# Patient Record
Sex: Male | Born: 1980 | Smoking: Never smoker
Health system: Southern US, Community
[De-identification: ages and names within clinical notes are randomized; demographics above are authoritative.]

## PROBLEM LIST (undated history)

## (undated) DIAGNOSIS — L409 Psoriasis, unspecified: Secondary | ICD-10-CM

## (undated) DIAGNOSIS — M1990 Unspecified osteoarthritis, unspecified site: Secondary | ICD-10-CM

## (undated) HISTORY — DX: Unspecified osteoarthritis, unspecified site: M19.90

## (undated) HISTORY — DX: Psoriasis, unspecified: L40.9

---

## 2014-07-29 ENCOUNTER — Ambulatory Visit (HOSPITAL_COMMUNITY)
Admission: RE | Admit: 2014-07-29 | Discharge: 2014-07-29 | Disposition: A | Discharge status: Home / Self Care | Payer: Managed Care, Other (non HMO) | Source: Ambulatory Visit | Attending: Rheumatology | Admitting: Rheumatology

## 2014-07-29 ENCOUNTER — Other Ambulatory Visit (HOSPITAL_COMMUNITY): Payer: Self-pay | Admitting: Rheumatology

## 2014-07-29 DIAGNOSIS — Z01818 Encounter for other preprocedural examination: Secondary | ICD-10-CM | POA: Diagnosis present

## 2014-07-29 DIAGNOSIS — M1990 Unspecified osteoarthritis, unspecified site: Secondary | ICD-10-CM | POA: Diagnosis not present

## 2014-07-29 DIAGNOSIS — Z9225 Personal history of immunosupression therapy: Secondary | ICD-10-CM

## 2015-08-18 ENCOUNTER — Other Ambulatory Visit (HOSPITAL_COMMUNITY): Payer: Self-pay | Admitting: Rheumatology

## 2015-08-18 ENCOUNTER — Ambulatory Visit (HOSPITAL_COMMUNITY)
Admission: RE | Admit: 2015-08-18 | Discharge: 2015-08-18 | Disposition: A | Discharge status: Home / Self Care | Payer: Managed Care, Other (non HMO) | Source: Ambulatory Visit | Attending: Rheumatology | Admitting: Rheumatology

## 2015-08-18 DIAGNOSIS — R7611 Nonspecific reaction to tuberculin skin test without active tuberculosis: Secondary | ICD-10-CM | POA: Diagnosis present

## 2016-02-02 ENCOUNTER — Ambulatory Visit (INDEPENDENT_AMBULATORY_CARE_PROVIDER_SITE_OTHER): Payer: Managed Care, Other (non HMO) | Admitting: Rheumatology

## 2016-02-02 DIAGNOSIS — M79642 Pain in left hand: Secondary | ICD-10-CM

## 2016-02-02 DIAGNOSIS — M79672 Pain in left foot: Secondary | ICD-10-CM | POA: Diagnosis not present

## 2016-02-02 DIAGNOSIS — M79641 Pain in right hand: Secondary | ICD-10-CM | POA: Diagnosis not present

## 2016-02-02 DIAGNOSIS — L408 Other psoriasis: Secondary | ICD-10-CM

## 2016-02-02 DIAGNOSIS — M79671 Pain in right foot: Secondary | ICD-10-CM | POA: Diagnosis not present

## 2016-02-02 DIAGNOSIS — Z79899 Other long term (current) drug therapy: Secondary | ICD-10-CM

## 2016-02-02 DIAGNOSIS — L4050 Arthropathic psoriasis, unspecified: Secondary | ICD-10-CM | POA: Diagnosis not present

## 2016-03-23 ENCOUNTER — Other Ambulatory Visit: Payer: Self-pay | Admitting: Rheumatology

## 2016-03-24 ENCOUNTER — Other Ambulatory Visit: Payer: Self-pay | Admitting: Rheumatology

## 2016-03-24 NOTE — Telephone Encounter (Signed)
Last Visit: 02/02/16 Next Visit: 07/05/16 Labs: 02/03/16 WNL  Okay to refill MTX?

## 2016-03-24 NOTE — Telephone Encounter (Signed)
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   Okay to refill Humira?

## 2016-03-29 ENCOUNTER — Encounter: Payer: Self-pay | Admitting: Rheumatology

## 2016-03-29 ENCOUNTER — Telehealth: Payer: Self-pay | Admitting: Rheumatology

## 2016-03-29 ENCOUNTER — Ambulatory Visit (INDEPENDENT_AMBULATORY_CARE_PROVIDER_SITE_OTHER): Payer: Managed Care, Other (non HMO) | Admitting: Rheumatology

## 2016-03-29 VITALS — BP 118/70 | HR 78 | Resp 14 | Ht 70.0 in | Wt 173.0 lb

## 2016-03-29 DIAGNOSIS — R7612 Nonspecific reaction to cell mediated immunity measurement of gamma interferon antigen response without active tuberculosis: Secondary | ICD-10-CM | POA: Diagnosis not present

## 2016-03-29 DIAGNOSIS — M25551 Pain in right hip: Secondary | ICD-10-CM | POA: Diagnosis not present

## 2016-03-29 DIAGNOSIS — L4050 Arthropathic psoriasis, unspecified: Secondary | ICD-10-CM

## 2016-03-29 DIAGNOSIS — R7989 Other specified abnormal findings of blood chemistry: Secondary | ICD-10-CM | POA: Diagnosis not present

## 2016-03-29 DIAGNOSIS — Z79899 Other long term (current) drug therapy: Secondary | ICD-10-CM | POA: Diagnosis not present

## 2016-03-29 DIAGNOSIS — L408 Other psoriasis: Secondary | ICD-10-CM | POA: Diagnosis not present

## 2016-03-29 DIAGNOSIS — R945 Abnormal results of liver function studies: Secondary | ICD-10-CM

## 2016-03-29 MED ORDER — METHOTREXATE 2.5 MG PO TABS
15.0000 mg | ORAL_TABLET | ORAL | 0 refills | Status: DC
Start: 2016-03-29 — End: 2016-03-30

## 2016-03-29 MED ORDER — DICLOFENAC SODIUM 1 % TD GEL: TRANSDERMAL | 3 refills | Status: DC

## 2016-03-29 NOTE — Progress Notes (Signed)
Rheumatology Medication Review by a Pharmacist Does the patient feel that his/her medications are working for him/her?  Yes except he reports he is having hip pain since his methotrexate dose was reduced.   Has the patient been experiencing any side effects to the medications prescribed?  No Does the patient have any problems obtaining medications?  No  Issues to address at subsequent visits: None   Pharmacist comments:  [name] is a pleasant 35 yo M who presents for follow up of psoriatic arthritis and psoriasis.  He is currently taking Humira 40 mg every other week and methotrexate 4 tablets per week.  Methotrexate dose was reduced from 8 tablets per week to 4 tablets per week on 10/15/15 due to elevations in ALT.  Most recent standing orders were on 02/02/16.  He will be due for standing labs again in January 2018.  Patient has yearly chest x-ray.  Most recent chest x-ray was on 08/18/15.  He will be due for chest x-ray again in May 2018.  Patient denied any medication related questions or concerns at this time.    Lilla Shookachel Henderson, Pharm.D., BCPS Clinical Pharmacist Pager: [phone] Phone: [phone] 03/29/2016 9:07 AM

## 2016-03-29 NOTE — Patient Instructions (Addendum)
Keep march  2018 appt We will see if methotrexate 8 per week Works well for you.     Hip Exercises Ask your health care provider which exercises are safe for you. Do exercises exactly as told by your health care provider and adjust them as directed. It is normal to feel mild stretching, pulling, tightness, or discomfort as you do these exercises, but you should stop right away if you feel sudden pain or your pain gets worse.Do not begin these exercises until told by your health care provider. STRETCHING AND RANGE OF MOTION EXERCISES  These exercises warm up your muscles and joints and improve the movement and flexibility of your hip. These exercises also help to relieve pain, numbness, and tingling. Exercise A: Hamstrings, Supine  1. Lie on your back. 2. Loop a belt or towel over the ball of your left / rightfoot. The ball of your foot is on the walking surface, right under your toes. 3. Straighten your left / rightknee and slowly pull on the belt to raise your leg.  Do not let your left / right knee bend while you do this.  Keep your other leg flat on the floor.  Raise the left / right leg until you feel a gentle stretch behind your left / right knee or thigh. 4. Hold this position for __________ seconds. 5. Slowly return your leg to the starting position. Repeat __________ times. Complete this stretch __________ times a day. Exercise B: Hip Rotators  1. Lie on your back on a firm surface. 2. Hold your left / right knee with your left / right hand. Hold your ankle with your other hand. 3. Gently pull your left / right knee and rotate your lower leg toward your other shoulder.  Pull until you feel a stretch in your buttocks.  Keep your hips and shoulders firmly planted while you do this stretch. 4. Hold this position for __________ seconds. Repeat __________ times. Complete this stretch __________ times a day. Exercise C: V-Sit (Hamstrings and Adductors)  1. Sit on the floor  with your legs extended in a large "V" shape. Keep your knees straight during this exercise. 2. Start with your head and chest upright, then bend at your waist to reach for your left foot (position A). You should feel a stretch in your right inner thigh. 3. Hold this position for __________ seconds. Then slowly return to the upright position. 4. Bend at your waist to reach forward (position B). You should feel a stretch behind both of your thighs and knees. 5. Hold this position for __________ seconds. Then slowly return to the upright position. 6. Bend at your waist to reach for your right foot (position C). You should feel a stretch in your left inner thigh. 7. Hold this position for __________ seconds. Then slowly return to the upright position. Repeat __________ times. Complete this stretch __________ times a day. Exercise D: Lunge (Hip Flexors)  1. Place your left / right knee on the floor and bend your other knee so that is directly over your ankle. You should be half-kneeling. 2. Keep good posture with your head over your shoulders. 3. Tighten your buttocks to point your tailbone downward. This helps your back to keep from arching too much. 4. You should feel a gentle stretch in the front of your left / right thigh and hip. If you do not feel any resistance, slightly slide your other foot forward and then slowly lunge forward so your knee once  again lines up over your ankle. 5. Make sure your tailbone continues to point downward. 6. Hold this position for __________ seconds. Repeat __________ times. Complete this stretch __________ times a day. STRENGTHENING EXERCISES  These exercises build strength and endurance in your hip. Endurance is the ability to use your muscles for a long time, even after they get tired. Exercise E: Bridge (Hip Extensors)  1. Lie on your back on a firm surface with your knees bent and your feet flat on the floor. 2. Tighten your buttocks muscles and lift your  bottom off the floor until the trunk of your body is level with your thighs.  Do not arch your back.  You should feel the muscles working in your buttocks and the back of your thighs. If you do not feel these muscles, slide your feet 1-2 inches (2.5-5 cm) farther away from your buttocks. 3. Hold this position for __________ seconds. 4. Slowly lower your hips to the starting position. 5. Let your muscles relax completely between repetitions. 6. If this exercise is too easy, try doing it with your arms crossed over your chest. Repeat __________ times. Complete this exercise __________ times a day. Exercise F: Straight Leg Raises - Hip Abductors  1. Lie on your side with your left / right leg in the top position. Lie so your head, shoulder, knee, and hip line up with each other. You may bend your bottom knee to help you balance. 2. Roll your hips slightly forward, so your hips are stacked directly over each other and your left / right knee is facing forward. 3. Leading with your heel, lift your top leg 4-6 inches (10-15 cm). You should feel the muscles in your outer hip lifting.  Do not let your foot drift forward.  Do not let your knee roll toward the ceiling. 4. Hold this position for __________ seconds. 5. Slowly return to the starting position. 6. Let your muscles relax completely between repetitions. Repeat __________ times. Complete this exercise __________ times a day. Exercise G: Straight Leg Raises - Hip Adductors  1. Lie on your side with your left / right leg in the bottom position. Lie so your head, shoulder, knee, and hip line up. You may place your upper foot in front to help you balance. 2. Roll your hips slightly forward, so your hips are stacked directly over each other and your left / right knee is facing forward. 3. Tense the muscles in your inner thigh and lift your bottom leg 4-6 inches (10-15 cm). 4. Hold this position for __________ seconds. 5. Slowly return to the  starting position. 6. Let your muscles relax completely between repetitions. Repeat __________ times. Complete this exercise __________ times a day. Exercise H: Straight Leg Raises - Quadriceps  1. Lie on your back with your left / right leg extended and your other knee bent. 2. Tense the muscles in the front of your left / right thigh. When you do this, you should see your kneecap slide up or see increased dimpling just above your knee. 3. Tighten these muscles even more and raise your leg 4-6 inches (10-15 cm) off the floor. 4. Hold this position for __________ seconds. 5. Keep these muscles tense as you lower your leg. 6. Relax the muscles slowly and completely between repetitions. Repeat __________ times. Complete this exercise __________ times a day. Exercise I: Hip Abductors, Standing 1. Tie one end of a rubber exercise band or tubing to a secure surface, such as a  table or pole. 2. Loop the other end of the band or tubing around your left / right ankle. 3. Keeping your ankle with the band or tubing directly opposite of the secured end, step away until there is tension in the tubing or band. Hold onto a chair as needed for balance. 4. Lift your left / right leg out to your side. While you do this:  Keep your back upright.  Keep your shoulders over your hips.  Keep your toes pointing forward.  Make sure to use your hip muscles to lift your leg. Do not "throw" your leg or tip your body to lift your leg. 5. Hold this position for __________ seconds. 6. Slowly return to the starting position. Repeat __________ times. Complete this exercise __________ times a day. Exercise J: Squats (Quadriceps) 1. Stand in a door frame so your feet and knees are in line with the frame. You may place your hands on the frame for balance. 2. Slowly bend your knees and lower your hips like you are going to sit in a chair.  Keep your lower legs in a straight-up-and-down position.  Do not let your hips  go lower than your knees.  Do not bend your knees lower than told by your health care provider.  If your hip pain increases, do not bend as low. 3. Hold this position for ___________ seconds. 4. Slowly push with your legs to return to standing. Do not use your hands to pull yourself to standing. Repeat __________ times. Complete this exercise __________ times a day. This information is not intended to replace advice given to you by your health care provider. Make sure you discuss any questions you have with your health care provider. Document Released: 04/22/2005 Document Revised: 12/28/2015 Document Reviewed: 03/30/2015 Elsevier Interactive Patient Education  2017 [employer].    Standing Labs We placed an order today for your standing lab work.    Please come back and get your standing labs in January 2018 and every 3 months.   We have open lab Monday through Friday from 8:30-11:30 AM and 1:30-4 PM at the office of Dr. Arbutus Ped, PA.   The office is located at [address], [place], [place] [address] No appointment is necessary.   Labs are drawn by [facility].  You may receive a bill from [facility] for your lab work.

## 2016-03-29 NOTE — Progress Notes (Signed)
Office Visit Note  Patient: Colton Anthony             Date of Birth: 1981/02/21           MRN: [id number]             PCP: No PCP Per Patient Referring: No ref. provider found Visit Date: 03/29/2016 Occupation: @GUAROCC @    Subjective:  Back Pain (right SI joint are painful x1week started after bowling)   History of Present Illness: Colton Anthony is a 35 y.o. male  Last seen February 02 2016. Patient has a history of psoriatic arthritis and psoriasis and has been doing well with his Humira every 2 weeks and methotrexate 4 pills per week and folic acid 1 per day. Certainly she he's been exercising more and states that he's having increased pain to his right foot. He notes that when he was on 8 pills of methotrexate per week he was doing the same amount of exercising and he did not have any pain anywhere to his body. He is suggesting to increase his methotrexate from 4 pills per week to 6 pills per week to see if that will resolve his right foot pain and I'm agreeable.  About a week ago patient was bowling. He bowls with his left hand. She was bowling I believe he strained his right hip/buttock muscles. GERD day everything resolved. Then in an attempt to explain to his wife what was happening he recuperated the movements that originally injured his right hip/gluteus area. Thereafter he restarted having the pain that had originally resolved.  No other complaints. No patient has a history of TB coping positive and INH treatment therefore he gets yearly chest x-rays. This will be due May 2018. He does not have any Voltaren gel and I'll give him prescription for same.  Activities of Daily Living:  Patient reports morning stiffness for 15 minutes.   Patient Reports nocturnal pain.  Difficulty dressing/grooming: Denies Difficulty climbing stairs: Reports Difficulty getting out of chair: Reports Difficulty using hands for taps, buttons, cutlery, and/or writing: Denies   Review of Systems    Constitutional: Negative for fatigue.  HENT: Negative for mouth sores and mouth dryness.   Eyes: Negative for dryness.  Respiratory: Negative for shortness of breath.   Gastrointestinal: Negative for constipation and diarrhea.  Musculoskeletal: Negative for myalgias and myalgias.  Skin: Negative for sensitivity to sunlight.  Neurological: Negative for memory loss.  Psychiatric/Behavioral: Negative for sleep disturbance.    PMFS History:  Patient Active Problem List   Diagnosis Date Noted  . 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 Other psoriasis 03/29/2016  . 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 High risk medication use 03/29/2016  . Elevated LFTs 03/29/2016    No past medical history on file.  No family history on file. No past surgical history on file. Social History   Social History Narrative  . No narrative on file     Objective: Vital Signs: BP 118/70   Pulse 78   Resp 14   Ht 5\' 10"  (1.778 m)   Wt 173 lb (78.5 kg)   BMI 24.82 kg/m    Physical Exam  Constitutional: He is oriented to person, place, and time. He appears well-developed and well-nourished.  HENT:  Head: Normocephalic and atraumatic.  Eyes: Conjunctivae and EOM are normal. Pupils are equal, round, and reactive to light.  Neck: Normal range of motion. Neck supple.  Cardiovascular: Normal rate, regular rhythm and normal heart sounds.  Exam reveals no gallop and no friction rub.   No murmur heard. Pulmonary/Chest: Effort normal and breath sounds normal. No respiratory distress. He has no wheezes. He has no rales. He exhibits no tenderness.  Abdominal: Soft. He exhibits no distension and no mass. There is no tenderness. There is no guarding.  Musculoskeletal: Normal range of motion.  Lymphadenopathy:    He has no cervical adenopathy.  Neurological: He is alert and oriented to person, place, and time. He exhibits normal muscle tone. Coordination normal.  Skin: Skin is warm  and dry. Capillary refill takes less than 2 seconds. No rash noted.  Psychiatric: He has a normal mood and affect. His behavior is normal. Judgment and thought content normal.  Nursing note and vitals reviewed.    Musculoskeletal Exam:  Full range of motion of all joints Grip strength is equal and strong bilaterally Fibromyalgia tender points are all absent  CDAI Exam: No CDAI exam completed.  No synovitis on examination  Investigation: Findings:  08/01/2014  positive for TB Gold and he was treated with INH treatment.   Psoriatic Arthritis .  He has erosive disease with dactylitis  07/2014:  CBC, comprehensive metabolic panel, sed rate, hep panel and HIV were negative.  Uric acid was normal.  Rheumatoid factor was negative.  ANA was negative.  CCP was negative.  Immunoglobulin's were normal.  SPEP was normal and TB Gold was positive.   07/01/2016We did obtain x-ray of bilateral knee joints  which were within normal limits without any significant joint space narrowing or chondrocalcinosis.  No significant patellofemoral narrowing.  Labs: 02/02/2016 ===>  CMP with GFR is normal except for bilirubin slightly elevated at 1.3 CBC with differential is normal Labs will be due again in 3 months from this date which is January 2018 and he will come to our office and get that done.  Imaging: No results found.  Speciality Comments: No specialty comments available.    Procedures:  No procedures performed Allergies: Patient has no known allergies.   Assessment / Plan:     Visit Diagnoses: Pain in right hip  Arthropathic psoriasis, unspecified (HCC)  Other psoriasis  High risk medication use - 03/29/2016: ==> humira q 2 weeks; mtx 4/week (some breakthrough pain right foot); folic 1/day;   Elevated LFTs  Positive QuantiFERON-TB Gold test   Currently having pain in his right foot with increased exercise. Patient did not have this pain when he was on the higher dose of methotrexate.  Now that he is on the 4 pills per week he notices the pain. He's asking for us to increase his methotrexate from 4 pills to 6 pills per week and I am agreeable. I'll refill his medication accordingly. If he does really well we will consider going down to 5 pills per week in about 3 months.  Patient gets a yearly chest x-ray because he's TB gold positive and had a treatment with INH in the past. The yearly chest x-ray will be due May 2018.  Plan: Voltaren gel. I've advised the patient how to use this medication to his right hip area. He will use it 3 times a day when necessary. Also I've given him a handout for hip joint exercises Continue Humira every 2 weeks Note that methotrexate was increased to 6 pills per week and a 90 day supply was given  Return to clinic in 3 months for regular follow-up.  Orders: No orders of the defined types were placed in this encounter.  Meds  ordered this encounter  Medications  . methotrexate (RHEUMATREX) 2.5 MG tablet    Sig: Take 6 tablets (15 mg total) by mouth once a week. Caution:Chemotherapy. Protect from light.    Dispense:  72 tablet    Refill:  0    Order Specific Question:   Supervising Provider    Answer:   Pollyann SavoyEVESHWAR, SHAILI [[id number]]  . diclofenac sodium (VOLTAREN) 1 % GEL    Sig: Voltaren Gel 3 grams to 3 large joints upto TID 3 TUBES with 3 refills    Dispense:  3 Tube    Refill:  3    Voltaren Gel 3 grams to 3 large joints upto TID 3 TUBES with 3 refills    Order Specific Question:   Supervising Provider    Answer:   Vanessa KickEVESHWAR, SHAILI [[id number]]    Face-to-face time spent with patient was 30 minutes. 50% of time was spent in counseling and coordination of care.  Follow-Up Instructions: Return in about 6 months (around 09/27/2016).   Tawni PummelNaitik Bralyn Folkert, PA-C

## 2016-03-30 ENCOUNTER — Other Ambulatory Visit: Payer: Self-pay | Admitting: Rheumatology

## 2016-03-30 NOTE — Telephone Encounter (Signed)
Resent/ Mr Leane Callanwala approved this yesterday

## 2016-04-20 NOTE — Telephone Encounter (Signed)
Opened by mistake.

## 2016-04-21 ENCOUNTER — Other Ambulatory Visit: Payer: Self-pay | Admitting: Rheumatology

## 2016-04-21 NOTE — Telephone Encounter (Signed)
[facility] needs to [facility] for his MTX and Humira pen and will not fill from his previous rx. Please send a new rx to [facility] at phone# [phone] Patient is almost out of medication

## 2016-04-22 MED ORDER — ADALIMUMAB 40 MG/0.8ML ~~LOC~~ AJKT: 1.0000 "pen " | AUTO-INJECTOR | SUBCUTANEOUS | 0 refills | Status: DC

## 2016-04-22 MED ORDER — METHOTREXATE 2.5 MG PO TABS: ORAL_TABLET | ORAL | 1 refills | Status: DC

## 2016-04-22 NOTE — Telephone Encounter (Signed)
Prescriptions sent to [facility] per patient request.

## 2016-07-04 ENCOUNTER — Other Ambulatory Visit: Payer: Self-pay | Admitting: Rheumatology

## 2016-07-04 LAB — COMPLETE METABOLIC PANEL WITH GFR
ALK PHOS: 40 U/L (ref 40–115)
ALT: 32 U/L (ref 9–46)
AST: 22 U/L (ref 10–40)
Albumin: 4.5 g/dL (ref 3.6–5.1)
BILIRUBIN TOTAL: 0.7 mg/dL (ref 0.2–1.2)
BUN: 9 mg/dL (ref 7–25)
CALCIUM: 9.5 mg/dL (ref 8.6–10.3)
CO2: 26 mmol/L (ref 20–31)
CREATININE: 0.83 mg/dL (ref 0.60–1.35)
Chloride: 106 mmol/L (ref 98–110)
GFR, Est Non African American: >89 mL/min (ref >=60)
Glucose, Bld: 95 mg/dL (ref 65–99)
Potassium: 4.7 mmol/L (ref 3.5–5.3)
Sodium: 140 mmol/L (ref 135–146)
TOTAL PROTEIN: 7 g/dL (ref 6.1–8.1)

## 2016-07-04 LAB — CBC WITH DIFFERENTIAL/PLATELET
BASOS PCT: 1 %
Basophils Absolute: 55 cells/uL (ref 0–200)
Eosinophils Absolute: 55 cells/uL (ref 15–500)
Eosinophils Relative: 1 %
HCT: 42.4 % (ref 38.5–50.0)
Hemoglobin: 13.9 g/dL (ref 13.2–17.1)
Lymphocytes Relative: 46 %
Lymphs Abs: 2530 cells/uL (ref 850–3900)
MCH: 29.3 pg (ref 27.0–33.0)
MCHC: 32.8 g/dL (ref 32.0–36.0)
MCV: 89.5 fL (ref 80.0–100.0)
MONOS PCT: 8 %
MPV: 11.2 fL (ref 7.5–12.5)
Monocytes Absolute: 440 cells/uL (ref 200–950)
Neutro Abs: 2420 cells/uL (ref 1500–7800)
Neutrophils Relative %: 44 %
PLATELETS: 237 10*3/uL (ref 140–400)
RBC: 4.74 MIL/uL (ref 4.20–5.80)
RDW: 15.1 % — AB (ref 11.0–15.0)
WBC: 5.5 10*3/uL (ref 3.8–10.8)

## 2016-07-05 ENCOUNTER — Ambulatory Visit (INDEPENDENT_AMBULATORY_CARE_PROVIDER_SITE_OTHER): Payer: Managed Care, Other (non HMO) | Admitting: Rheumatology

## 2016-07-05 ENCOUNTER — Encounter: Payer: Self-pay | Admitting: Rheumatology

## 2016-07-05 VITALS — BP 124/70 | HR 68 | Resp 16 | Wt 170.0 lb

## 2016-07-05 DIAGNOSIS — L408 Other psoriasis: Secondary | ICD-10-CM | POA: Diagnosis not present

## 2016-07-05 DIAGNOSIS — R945 Abnormal results of liver function studies: Secondary | ICD-10-CM

## 2016-07-05 DIAGNOSIS — Z79899 Other long term (current) drug therapy: Secondary | ICD-10-CM

## 2016-07-05 DIAGNOSIS — L4050 Arthropathic psoriasis, unspecified: Secondary | ICD-10-CM | POA: Diagnosis not present

## 2016-07-05 DIAGNOSIS — R7989 Other specified abnormal findings of blood chemistry: Secondary | ICD-10-CM | POA: Diagnosis not present

## 2016-07-05 DIAGNOSIS — M25551 Pain in right hip: Secondary | ICD-10-CM | POA: Diagnosis not present

## 2016-07-05 DIAGNOSIS — R7612 Nonspecific reaction to cell mediated immunity measurement of gamma interferon antigen response without active tuberculosis: Secondary | ICD-10-CM

## 2016-07-05 MED ORDER — FOLIC ACID 1 MG PO TABS: 2.0000 mg | ORAL_TABLET | Freq: Every day | ORAL | 4 refills | Status: DC

## 2016-07-05 MED ORDER — METHOTREXATE 2.5 MG PO TABS: ORAL_TABLET | ORAL | 0 refills | Status: DC

## 2016-07-05 NOTE — Progress Notes (Signed)
labs normal

## 2016-07-05 NOTE — Progress Notes (Signed)
Rheumatology Medication Review by a Pharmacist Does the patient feel that his/her medications are working for him/her?  Yes Has the patient been experiencing any side effects to the medications prescribed?  No Does the patient have any problems obtaining medications?  No  Issues to address at subsequent visits: None   Pharmacist comments:  [name] is a pleasant 36 yo M who presents for follow up of psoriatic arthritis and psoriasis.  He is currently taking Humira 40 mg every other week and methotrexate 6 tablets per week and folic acid 2 mg daily.  Methotrexate dose was recently increased from 4 to 6 tablets weekly in December 2017.  Most recent standing orders were on 07/04/16.  He will be due for standing labs again in May or June 2018.  Patient has yearly chest x-ray.  Most recent chest x-ray was on 08/18/15.  He will be due for chest x-ray again in May 2018.  Patient denied any medication related questions or concerns at this time.   Lilla Shookachel Brooklyne Radke, Pharm.D., BCPS, CPP Clinical Pharmacist Pager: [phone] Phone: [phone] 07/05/2016 9:58 AM

## 2016-07-05 NOTE — Progress Notes (Signed)
Office Visit Note  Patient: Colton Anthony             Date of Birth: [birth year]           MRN: [id number]             PCP: No PCP Per Patient Referring: No ref. provider found Visit Date: 07/05/2016 Occupation: _0 @    Subjective:  Follow-up   History of Present Illness: Colton Anthony is a 36 y.o. male  Last seen 03/29/2016.  Since the last visit, we increased patient's methotrexate from 4 pills to 6 pills due to having flares and having right foot pain. Patient states today that he is pain-free. He is really benefiting from increasing his methotrexate to 6 pills per week.  He also takes Humira every 2 weeks    Activities of Daily Living:  Patient reports morning stiffness for 2 minutes.   Patient Denies nocturnal pain.  Difficulty dressing/grooming: Denies Difficulty climbing stairs: Denies Difficulty getting out of chair: Denies Difficulty using hands for taps, buttons, cutlery, and/or writing: Denies   No Rheumatology ROS completed.   PMFS History:  Patient Active Problem List   Diagnosis Date Noted  . Right hip pain 07/05/2016  . Arthropathic psoriasis, unspecified ([place]) 03/29/2016  . Other psoriasis 03/29/2016  . 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 High risk medication use 03/29/2016  . Elevated LFTs 03/29/2016    Past Medical History:  Diagnosis Date  . Arthritis    Psoriatic arthritis   . Psoriasis     History reviewed. No pertinent family history. History reviewed. No pertinent surgical history. Social History   Social History Narrative  . No narrative on file     Objective: Vital Signs: BP 124/70   Pulse 68   Resp 16   Wt 170 lb (77.1 kg)   BMI 24.39 kg/m    Physical Exam   Musculoskeletal Exam:  Full range of motion of all joints Grip strength is equal and strong bilaterally For myalgia tender points are all absent  CDAI Exam: No CDAI exam completed.    Investigation: Findings:  08/01/2014  positive for TB  Gold and he was treated with INH treatment.   Psoriatic Arthritis .  He has erosive disease with dactylitis  07/2014:  CBC, comprehensive metabolic panel, sed rate, hep panel and HIV were negative.  Uric acid was normal.  Rheumatoid factor was negative.  ANA was negative.  CCP was negative.  Immunoglobulin's were normal.  SPEP was normal and TB Gold was positive.   07/01/2016We did obtain x-ray of bilateral knee joints  which were within normal limits without any significant joint space narrowing or chondrocalcinosis.  No significant patellofemoral narrowing.  Labs: 02/02/2016 ===>  CMP with GFR is normal except for bilirubin slightly elevated at 1.3 CBC with differential is normal Labs will be due again in 3 months from this date which is January 2018 and he will come to our office and get that done.  Yearly chest x-ray will be due May 2018  Orders Only on 07/04/2016  Component Date Value Ref Range Status  . Sodium 07/04/2016 140  135 - 146 mmol/L Final  . Potassium 07/04/2016 4.7  3.5 - 5.3 mmol/L Final  . Chloride 07/04/2016 106  98 - 110 mmol/L Final  . CO2 07/04/2016 26  20 - 31 mmol/L Final  . Glucose, Bld 07/04/2016 95  65 - 99 mg/dL Final  . BUN 07/04/2016 9  7 - 25 mg/dL Final  .  Creat 07/04/2016 0.83  0.60 - 1.35 mg/dL Final  . Total Bilirubin 07/04/2016 0.7  0.2 - 1.2 mg/dL Final  . Alkaline Phosphatase 07/04/2016 40  40 - 115 U/L Final  . AST 07/04/2016 22  10 - 40 U/L Final  . ALT 07/04/2016 32  9 - 46 U/L Final  . Total Protein 07/04/2016 7.0  6.1 - 8.1 g/dL Final  . Albumin 07/04/2016 4.5  3.6 - 5.1 g/dL Final  . Calcium 07/04/2016 9.5  8.6 - 10.3 mg/dL Final  . GFR, Est African American 07/04/2016 >89  >=60 mL/min Final  . GFR, Est Non African American 07/04/2016 >89  >=60 mL/min Final  . WBC 07/04/2016 5.5  3.8 - 10.8 K/uL Final  . RBC 07/04/2016 4.74  4.20 - 5.80 MIL/uL Final  . Hemoglobin 07/04/2016 13.9  13.2 - 17.1 g/dL Final  . HCT 07/04/2016 42.4  38.5 - 50.0 %  Final  . MCV 07/04/2016 89.5  80.0 - 100.0 fL Final  . MCH 07/04/2016 29.3  27.0 - 33.0 pg Final  . MCHC 07/04/2016 32.8  32.0 - 36.0 g/dL Final  . RDW 07/04/2016 15.1* 11.0 - 15.0 % Final  . Platelets 07/04/2016 237  140 - 400 K/uL Final  . MPV 07/04/2016 11.2  7.5 - 12.5 fL Final  . Neutro Abs 07/04/2016 2420  1,500 - 7,800 cells/uL Final  . Lymphs Abs 07/04/2016 2530  850 - 3,900 cells/uL Final  . Monocytes Absolute 07/04/2016 440  200 - 950 cells/uL Final  . Eosinophils Absolute 07/04/2016 55  15 - 500 cells/uL Final  . Basophils Absolute 07/04/2016 55  0 - 200 cells/uL Final  . Neutrophils Relative % 07/04/2016 44  % Final  . Lymphocytes Relative 07/04/2016 46  % Final  . Monocytes Relative 07/04/2016 8  % Final  . Eosinophils Relative 07/04/2016 1  % Final  . Basophils Relative 07/04/2016 1  % Final  . Smear Review 07/04/2016 Criteria for review not met   Final      Imaging: No results found.  Speciality Comments: No specialty comments available.    Procedures:  No procedures performed Allergies: Patient has no known allergies.   Assessment / Plan:     Visit Diagnoses: Arthropathic psoriasis, unspecified ([facility])  High risk medication use -  humira q 2 weeks; mtx 4/week  - Plan: CBC with Differential/Platelet, COMPLETE METABOLIC PANEL WITH GFR  Right hip pain  Other psoriasis  Elevated LFTs  Positive QuantiFERON-TB Gold test ==> therefore we will do annual cxr and ppd.  Plan: #1: Psoriatic arthritis. Doing well. Adequate response with current therapy. Currently on Humira every 2 weeks and methotrexate 6 per week and folic acid 2 mg daily. When the patient was on a lower dose of methotrexate (4 pills per week) he was having flares. Currently he's had good experience with 6 pills per week and we will keep him on 6 pills per week. (Since patient is doing so well with 6 pills per week, I wonder if he will benefit from dropping to 5 pills per week. I've asked the  patient to do 5 pills per week starting April 2018 and see how he does. However, I have written a prescription for 6 pills per week in case that symptoms that he requires for the next 90 days)  #2: [facility] 6 pills every week Dispense three-month supply with no refills  Refill folic acid 1 mg; 2 mg daily; dispense   90 day supply with 4 refills  #3: Patient needs annual chest x-ray starting May 2018. He was TB positive in the past and so we don't do the TB gold anymore for the patient  #4: Return to clinic in 5 months  #5: I advised the patient that he needs a flu shot annually. The flu season has just ended recently and therefore he will start getting the flu shot next flu season before it begins. Patient understands and is agreeable. He can get that through his PCP or at any of the pharmacies that give out the flu shot. He can also call his insurance company to find out where would be covered and he can make appointments accordingly.  #6: Refill Humira when due Note that Humira has a program which pays for the patient's methotrexate by reimbursing the patient is descending the bills. Patient will use that benefit to his advantage.  Orders: No orders of the defined types were placed in this encounter.  No orders of the defined types were placed in this encounter.   Face-to-face time spent with patient was 30 minutes. 50% of time was spent in counseling and coordination of care.  Follow-Up Instructions: No Follow-up on file.   Eliezer Lofts, PA-C  Note - This record has been created using [employer].  Chart creation errors have been sought, but may not always  have been located. Such creation errors do not reflect on  the standard of medical care.

## 2016-09-16 ENCOUNTER — Other Ambulatory Visit: Payer: Self-pay | Admitting: Rheumatology

## 2016-09-16 ENCOUNTER — Telehealth: Payer: Self-pay | Admitting: Pharmacist

## 2016-09-16 DIAGNOSIS — Z9225 Personal history of immunosupression therapy: Secondary | ICD-10-CM

## 2016-09-16 NOTE — Telephone Encounter (Signed)
Patient is due for follow-up on (around 12/05/2016). Therefore that's order a repeat chest x-ray due to positive TB goal in the past to screen for latent TB (patient is on a TNF inhibitor).

## 2016-09-16 NOTE — Telephone Encounter (Signed)
Received fax from [facility] regarding "TNF Inhibitors - Consider Screening for Latent TB."  Patient has history of positive TB Gold on 07/30/14.  [facility]  Patient is screened with yearly chest x-rays.  Most recent chest x-ray was on 08/18/15.    Patient is due to chest x-ray.

## 2016-09-16 NOTE — Telephone Encounter (Signed)
Last Visit: 07/05/16 Next Visit: 12/07/16 Labs: 07/04/16 WNL Chest X-ray normal 05/17  Okay to refill Humira?

## 2016-09-19 NOTE — Telephone Encounter (Signed)
Patient advised he is due to update his yearly chest x-ray. Patient verbalized understanding. Patient states he usually has it performed at [facility]. Order placed. Patient states he will go this week.

## 2016-09-28 ENCOUNTER — Ambulatory Visit (HOSPITAL_COMMUNITY)
Admission: RE | Admit: 2016-09-28 | Discharge: 2016-09-28 | Disposition: A | Discharge status: Home / Self Care | Payer: 59 | Source: Ambulatory Visit | Attending: Rheumatology | Admitting: Rheumatology

## 2016-09-28 DIAGNOSIS — Z9225 Personal history of immunosupression therapy: Secondary | ICD-10-CM | POA: Insufficient documentation

## 2016-10-13 ENCOUNTER — Other Ambulatory Visit: Payer: Self-pay | Admitting: *Deleted

## 2016-10-13 ENCOUNTER — Telehealth: Payer: Self-pay | Admitting: Rheumatology

## 2016-10-13 DIAGNOSIS — Z79899 Other long term (current) drug therapy: Secondary | ICD-10-CM

## 2016-10-13 NOTE — Telephone Encounter (Signed)
Patient called and would like his lab order mailed to him.  Thank you.

## 2016-10-13 NOTE — Telephone Encounter (Signed)
Lab order mailed to patient.

## 2016-10-26 ENCOUNTER — Other Ambulatory Visit: Payer: Self-pay | Admitting: Rheumatology

## 2016-10-26 LAB — COMPLETE METABOLIC PANEL WITH GFR
ALT: 34 U/L (ref 9–46)
AST: 24 U/L (ref 10–40)
Albumin: 4.4 g/dL (ref 3.6–5.1)
Alkaline Phosphatase: 43 U/L (ref 40–115)
BUN: 10 mg/dL (ref 7–25)
CHLORIDE: 102 mmol/L (ref 98–110)
CO2: 24 mmol/L (ref 20–31)
Calcium: 9.9 mg/dL (ref 8.6–10.3)
Creat: 0.87 mg/dL (ref 0.60–1.35)
GFR, Est African American: >89 mL/min (ref >=60)
GLUCOSE: 86 mg/dL (ref 65–99)
POTASSIUM: 5 mmol/L (ref 3.5–5.3)
SODIUM: 137 mmol/L (ref 135–146)
Total Bilirubin: 1.2 mg/dL (ref 0.2–1.2)
Total Protein: 7.3 g/dL (ref 6.1–8.1)

## 2016-10-26 LAB — CBC WITH DIFFERENTIAL/PLATELET
BASOS ABS: 52 {cells}/uL (ref 0–200)
Basophils Relative: 1 %
EOS PCT: 1 %
Eosinophils Absolute: 52 cells/uL (ref 15–500)
HEMATOCRIT: 43.7 % (ref 38.5–50.0)
HEMOGLOBIN: 14.6 g/dL (ref 13.2–17.1)
LYMPHS ABS: 2028 {cells}/uL (ref 850–3900)
Lymphocytes Relative: 39 %
MCH: 30.1 pg (ref 27.0–33.0)
MCHC: 33.4 g/dL (ref 32.0–36.0)
MCV: 90.1 fL (ref 80.0–100.0)
MONO ABS: 468 {cells}/uL (ref 200–950)
MPV: 11.4 fL (ref 7.5–12.5)
Monocytes Relative: 9 %
NEUTROS PCT: 50 %
Neutro Abs: 2600 cells/uL (ref 1500–7800)
Platelets: 232 10*3/uL (ref 140–400)
RBC: 4.85 MIL/uL (ref 4.20–5.80)
RDW: 14.7 % (ref 11.0–15.0)
WBC: 5.2 10*3/uL (ref 3.8–10.8)

## 2016-10-26 NOTE — Progress Notes (Signed)
WNL

## 2016-10-27 ENCOUNTER — Telehealth: Payer: Self-pay | Admitting: Radiology

## 2016-10-27 NOTE — Telephone Encounter (Signed)
-----   Message from Pollyann SavoyShaili Deveshwar, MD sent at 10/26/2016 10:23 PM EDT ----- WNL

## 2016-10-27 NOTE — Telephone Encounter (Signed)
I have called patient to advise labs are normal  

## 2016-12-07 ENCOUNTER — Ambulatory Visit (INDEPENDENT_AMBULATORY_CARE_PROVIDER_SITE_OTHER): Payer: Managed Care, Other (non HMO) | Admitting: Rheumatology

## 2016-12-07 ENCOUNTER — Encounter: Payer: Self-pay | Admitting: Rheumatology

## 2016-12-07 VITALS — BP 130/72 | HR 84 | Resp 14 | Ht 69.0 in | Wt 182.0 lb

## 2016-12-07 DIAGNOSIS — Z79899 Other long term (current) drug therapy: Secondary | ICD-10-CM

## 2016-12-07 DIAGNOSIS — M722 Plantar fascial fibromatosis: Secondary | ICD-10-CM

## 2016-12-07 DIAGNOSIS — L4050 Arthropathic psoriasis, unspecified: Secondary | ICD-10-CM

## 2016-12-07 DIAGNOSIS — L408 Other psoriasis: Secondary | ICD-10-CM | POA: Diagnosis not present

## 2016-12-07 DIAGNOSIS — M79641 Pain in right hand: Secondary | ICD-10-CM

## 2016-12-07 MED ORDER — METHOTREXATE 2.5 MG PO TABS: ORAL_TABLET | ORAL | 0 refills | Status: DC

## 2016-12-07 MED ORDER — CLOBETASOL PROPIONATE 0.05 % EX OINT: 1.0000 "application " | TOPICAL_OINTMENT | Freq: Two times a day (BID) | CUTANEOUS | 2 refills | Status: DC

## 2016-12-07 NOTE — Progress Notes (Signed)
Office Visit Note  Patient: Colton Anthony             Date of Birth: 1981/01/22           MRN: [id number]             PCP: Patient, No Pcp Per Referring: No ref. provider found Visit Date: 12/07/2016 Occupation: '@GUAROCC'$ @    Subjective:  Medication Management   History of Present Illness: Colton Anthony is a 36 y.o. male   Who was last seen in our office on 07/05/2016. On that visit he had to increase his methotrexate from 4 pills to 6 pills because he was having flares in his right foot. Once he went to 6 pills, he was pain-free. He also uses Humira every 2 weeks.  Today, he reports that he Decreased mtx from 6-5 and had another flare. He went back up to 6 and again all of his joints did well. He continues to use Humira every 2 weeks.  He did labs in July as prescribed. He did chest x-ray in June 2018 and that was normal also. His TB goal had come positive so we check him annually with a chest x-ray.  He complains of having left plantar foot pain.  Activities of Daily Living:  Patient reports morning stiffness for 15 minutes.   Patient Reports nocturnal pain.  Difficulty dressing/grooming: Denies Difficulty climbing stairs: Denies Difficulty getting out of chair: Denies Difficulty using hands for taps, buttons, cutlery, and/or writing: Reports   No Rheumatology ROS completed.   PMFS History:  Patient Active Problem List   Diagnosis Date Noted  . Right hip pain 07/05/2016  . Arthropathic psoriasis, unspecified ([place]) 03/29/2016  . Other psoriasis 03/29/2016  . 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 High risk medication use 03/29/2016  . Elevated LFTs 03/29/2016    Past Medical History:  Diagnosis Date  . Arthritis    Psoriatic arthritis   . Psoriasis     No family history on file. History reviewed. No pertinent surgical history. Social History   Social History Narrative  . No narrative on file     Objective: Vital Signs: BP 130/72   Pulse 84    Resp 14   Ht '5\' 9"'$  (1.753 m)   Wt 182 lb (82.6 kg)   BMI 26.88 kg/m    Physical Exam   Musculoskeletal Exam:  Full range of motion of all joints Grip strength is equal and strong bilaterally Fibromyalgia tender points are all absent  CDAI Exam: No CDAI exam completed.  No synovitis on examination  Investigation: No additional findings. Orders Only on 10/26/2016  Component Date Value Ref Range Status  . Sodium 10/26/2016 137  135 - 146 mmol/L Final  . Potassium 10/26/2016 5.0  3.5 - 5.3 mmol/L Final  . Chloride 10/26/2016 102  98 - 110 mmol/L Final  . CO2 10/26/2016 24  20 - 31 mmol/L Final  . Glucose, Bld 10/26/2016 86  65 - 99 mg/dL Final  . BUN 10/26/2016 10  7 - 25 mg/dL Final  . Creat 10/26/2016 0.87  0.60 - 1.35 mg/dL Final  . Total Bilirubin 10/26/2016 1.2  0.2 - 1.2 mg/dL Final  . Alkaline Phosphatase 10/26/2016 43  40 - 115 U/L Final  . AST 10/26/2016 24  10 - 40 U/L Final  . ALT 10/26/2016 34  9 - 46 U/L Final  . Total Protein 10/26/2016 7.3  6.1 - 8.1 g/dL Final  . Albumin 10/26/2016 4.4  3.6 - 5.1 g/dL Final  . Calcium 10/26/2016 9.9  8.6 - 10.3 mg/dL Final  . GFR, Est African American 10/26/2016 >89  >=60 mL/min Final  . GFR, Est Non African American 10/26/2016 >89  >=60 mL/min Final  . WBC 10/26/2016 5.2  3.8 - 10.8 K/uL Final  . RBC 10/26/2016 4.85  4.20 - 5.80 MIL/uL Final  . Hemoglobin 10/26/2016 14.6  13.2 - 17.1 g/dL Final  . HCT 10/26/2016 43.7  38.5 - 50.0 % Final  . MCV 10/26/2016 90.1  80.0 - 100.0 fL Final  . MCH 10/26/2016 30.1  27.0 - 33.0 pg Final  . MCHC 10/26/2016 33.4  32.0 - 36.0 g/dL Final  . RDW 10/26/2016 14.7  11.0 - 15.0 % Final  . Platelets 10/26/2016 232  140 - 400 K/uL Final  . MPV 10/26/2016 11.4  7.5 - 12.5 fL Final  . Neutro Abs 10/26/2016 2600  1,500 - 7,800 cells/uL Final  . Lymphs Abs 10/26/2016 2028  850 - 3,900 cells/uL Final  . Monocytes Absolute 10/26/2016 468  200 - 950 cells/uL Final  . Eosinophils Absolute 10/26/2016  52  15 - 500 cells/uL Final  . Basophils Absolute 10/26/2016 52  0 - 200 cells/uL Final  . Neutrophils Relative % 10/26/2016 50  % Final  . Lymphocytes Relative 10/26/2016 39  % Final  . Monocytes Relative 10/26/2016 9  % Final  . Eosinophils Relative 10/26/2016 1  % Final  . Basophils Relative 10/26/2016 1  % Final  . Smear Review 10/26/2016 Criteria for review not met   Final  Orders Only on 07/04/2016  Component Date Value Ref Range Status  . Sodium 07/04/2016 140  135 - 146 mmol/L Final  . Potassium 07/04/2016 4.7  3.5 - 5.3 mmol/L Final  . Chloride 07/04/2016 106  98 - 110 mmol/L Final  . CO2 07/04/2016 26  20 - 31 mmol/L Final  . Glucose, Bld 07/04/2016 95  65 - 99 mg/dL Final  . BUN 07/04/2016 9  7 - 25 mg/dL Final  . Creat 07/04/2016 0.83  0.60 - 1.35 mg/dL Final  . Total Bilirubin 07/04/2016 0.7  0.2 - 1.2 mg/dL Final  . Alkaline Phosphatase 07/04/2016 40  40 - 115 U/L Final  . AST 07/04/2016 22  10 - 40 U/L Final  . ALT 07/04/2016 32  9 - 46 U/L Final  . Total Protein 07/04/2016 7.0  6.1 - 8.1 g/dL Final  . Albumin 07/04/2016 4.5  3.6 - 5.1 g/dL Final  . Calcium 07/04/2016 9.5  8.6 - 10.3 mg/dL Final  . GFR, Est African American 07/04/2016 >89  >=60 mL/min Final  . GFR, Est Non African American 07/04/2016 >89  >=60 mL/min Final  . WBC 07/04/2016 5.5  3.8 - 10.8 K/uL Final  . RBC 07/04/2016 4.74  4.20 - 5.80 MIL/uL Final  . Hemoglobin 07/04/2016 13.9  13.2 - 17.1 g/dL Final  . HCT 07/04/2016 42.4  38.5 - 50.0 % Final  . MCV 07/04/2016 89.5  80.0 - 100.0 fL Final  . MCH 07/04/2016 29.3  27.0 - 33.0 pg Final  . MCHC 07/04/2016 32.8  32.0 - 36.0 g/dL Final  . RDW 07/04/2016 15.1* 11.0 - 15.0 % Final  . Platelets 07/04/2016 237  140 - 400 K/uL Final  . MPV 07/04/2016 11.2  7.5 - 12.5 fL Final  . Neutro Abs 07/04/2016 2420  1,500 - 7,800 cells/uL Final  . Lymphs Abs 07/04/2016 2530  850 - 3,900 cells/uL Final  .  Monocytes Absolute 07/04/2016 440  200 - 950 cells/uL Final  .  Eosinophils Absolute 07/04/2016 55  15 - 500 cells/uL Final  . Basophils Absolute 07/04/2016 55  0 - 200 cells/uL Final  . Neutrophils Relative % 07/04/2016 44  % Final  . Lymphocytes Relative 07/04/2016 46  % Final  . Monocytes Relative 07/04/2016 8  % Final  . Eosinophils Relative 07/04/2016 1  % Final  . Basophils Relative 07/04/2016 1  % Final  . Smear Review 07/04/2016 Criteria for review not met   Final     Imaging: No results found.  Speciality Comments: No specialty comments available.    Procedures:  No procedures performed Allergies: Patient has no known allergies.   Assessment / Plan:     Visit Diagnoses: No diagnosis found.    Plan: #1: Psoriatic arthritis. Patient doing well. No real flare. Patient is experimented with using little bit less dose of methotrexate. He does well for a few weeks and then he realizes he's having a breakthrough pain. He has found that going down to 5 pills of methotrexate or 4 pills of methotrexate is too low for his body's needs. He's found effective dose of 6 pills every week. He's currently on 6 pills of methotrexate. This is giving her adequate response. He also uses Humira every 2 weeks.  #2: [facility] prescription. Labs are up-to-date and normal. July 2018 CBC with differential and CMP with GFR were normal. Patient has a history of TB gold positive. For that reason we do a chest x-ray. His last chest x-ray was 09/28/2016 with the results as follows. CLINICAL DATA:  TB Gold.  Patient on immunosuppressant therapy.  EXAM: CHEST  2 VIEW  COMPARISON:  08/18/2015 and 07/29/2014 radiographs  FINDINGS: The cardiomediastinal silhouette is unremarkable.  There is no evidence of focal airspace disease, pulmonary edema, suspicious pulmonary nodule/mass, pleural effusion, or pneumothorax. No acute bony abnormalities are identified.  IMPRESSION: No active cardiopulmonary disease.  #3: Left foot with plantar fasciitis  (mild). I've advised the patient to go to shoe market on [address] and get inserts. He'll also do exercises by rubbing his feet on cold model of water. I've given him details in office. I'm also giving him patient instructions on plantar fasciitis and rehabilitation exercises. If patient continues to have pain in her pain worsens, we can explore other treatment options. He'll also use Voltaren gel.  #4:hand pain & feet pain at few dip joints  Pain resolved when patient restarted taking methotrexate at 6 pills per week. His left foot is having some plantar fasciitis pain and we will give him rehabilitation exercises for plantar fasciitis and he will use ice cold bottle and exercise as shown in the office when necessary.  #5: Return to clinicin 5 months    Orders: No orders of the defined types were placed in this encounter.  No orders of the defined types were placed in this encounter.   Face-to-face time spent with patient was 30 minutes. 50% of time was spent in counseling and coordination of care.  Follow-Up Instructions: No Follow-up on file.   Eliezer Lofts, PA-C I examined and evaluated the patient with Eliezer Lofts PA. Patient has been experiencing increased pain in his joints on lower dose of methotrexate. He has resumed his methotrexate to 6 tablets by mouth every week. He had no synovitis on my examination today. The plan of care was discussed as noted above.  Bo Merino, MD Note - This record has been  created using [employer].  Chart creation errors have been sought, but may not always  have been located. Such creation errors do not reflect on  the standard of medical care.

## 2016-12-07 NOTE — Patient Instructions (Signed)
Plantar Fasciitis Plantar fasciitis is a painful foot condition that affects the heel. It occurs when the band of tissue that connects the toes to the heel bone (plantar fascia) becomes irritated. This can happen after exercising too much or doing other repetitive activities (overuse injury). The pain from plantar fasciitis can range from mild irritation to severe pain that makes it difficult for you to walk or move. The pain is usually worse in the morning or after you have been sitting or lying down for a while. What are the causes? This condition may be caused by:  Standing for long periods of time.  Wearing shoes that do not fit.  Doing high-impact activities, including running, aerobics, and ballet.  Being overweight.  Having an abnormal way of walking (gait).  Having tight calf muscles.  Having high arches in your feet.  Starting a new athletic activity.  What are the signs or symptoms? The main symptom of this condition is heel pain. Other symptoms include:  Pain that gets worse after activity or exercise.  Pain that is worse in the morning or after resting.  Pain that goes away after you walk for a few minutes.  How is this diagnosed? This condition may be diagnosed based on your signs and symptoms. Your health care provider will also do a physical exam to check for:  A tender area on the bottom of your foot.  A high arch in your foot.  Pain when you move your foot.  Difficulty moving your foot.  You may also need to have imaging studies to confirm the diagnosis. These can include:  X-rays.  Ultrasound.  MRI.  How is this treated? Treatment for plantar fasciitis depends on the severity of the condition. Your treatment may include:  Rest, ice, and over-the-counter pain medicines to manage your pain.  Exercises to stretch your calves and your plantar fascia.  A splint that holds your foot in a stretched, upward position while you sleep (night  splint).  Physical therapy to relieve symptoms and prevent problems in the future.  Cortisone injections to relieve severe pain.  Extracorporeal shock wave therapy (ESWT) to stimulate damaged plantar fascia with electrical impulses. It is often used as a last resort before surgery.  Surgery, if other treatments have not worked after 12 months.  Follow these instructions at home:  Take medicines only as directed by your health care provider.  Avoid activities that cause pain.  Roll the bottom of your foot over a bag of ice or a bottle of cold water. Do this for 20 minutes, 3-4 times a day.  Perform simple stretches as directed by your health care provider.  Try wearing athletic shoes with air-sole or gel-sole cushions or soft shoe inserts.  Wear a night splint while sleeping, if directed by your health care provider.  Keep all follow-up appointments with your health care provider. How is this prevented?  Do not perform exercises or activities that cause heel pain.  Consider finding low-impact activities if you continue to have problems.  Lose weight if you need to. The best way to prevent plantar fasciitis is to avoid the activities that aggravate your plantar fascia. Contact a health care provider if:  Your symptoms do not go away after treatment with home care measures.  Your pain gets worse.  Your pain affects your ability to move or do your daily activities. This information is not intended to replace advice given to you by your health care provider. Make sure you   discuss any questions you have with your health care provider. Document Released: 12/28/2000 Document Revised: 09/07/2015 Document Reviewed: 02/12/2014 Elsevier Interactive Patient Education  2018 Elsevier Inc.   Plantar Fasciitis Rehab Ask your health care provider which exercises are safe for you. Do exercises exactly as told by your health care provider and adjust them as directed. It is normal to feel  mild stretching, pulling, tightness, or discomfort as you do these exercises, but you should stop right away if you feel sudden pain or your pain gets worse. Do not begin these exercises until told by your health care provider. Stretching and range of motion exercises These exercises warm up your muscles and joints and improve the movement and flexibility of your foot. These exercises also help to relieve pain. Exercise A: Plantar fascia stretch  1. Sit with your left / right leg crossed over your opposite knee. 2. Hold your heel with one hand with that thumb near your arch. With your other hand, hold your toes and gently pull them back toward the top of your foot. You should feel a stretch on the bottom of your toes or your foot or both. 3. Hold this stretch for__________ seconds. 4. Slowly release your toes and return to the starting position. Repeat __________ times. Complete this exercise __________ times a day. Exercise B: Gastroc, standing  1. Stand with your hands against a wall. 2. Extend your left / right leg behind you, and bend your front knee slightly. 3. Keeping your heels on the floor and keeping your back knee straight, shift your weight toward the wall without arching your back. You should feel a gentle stretch in your left / right calf. 4. Hold this position for __________ seconds. Repeat __________ times. Complete this exercise __________ times a day. Exercise C: Soleus, standing 1. Stand with your hands against a wall. 2. Extend your left / right leg behind you, and bend your front knee slightly. 3. Keeping your heels on the floor, bend your back knee and slightly shift your weight over the back leg. You should feel a gentle stretch deep in your calf. 4. Hold this position for __________ seconds. Repeat __________ times. Complete this exercise __________ times a day. Exercise D: Gastrocsoleus, standing 1. Stand with the ball of your left / right foot on a step. The ball of  your foot is on the walking surface, right under your toes. 2. Keep your other foot firmly on the same step. 3. Hold onto the wall or a railing for balance. 4. Slowly lift your other foot, allowing your body weight to press your heel down over the edge of the step. You should feel a stretch in your left / right calf. 5. Hold this position for __________ seconds. 6. Return both feet to the step. 7. Repeat this exercise with a slight bend in your left / right knee. Repeat __________ times with your left / right knee straight and __________ times with your left / right knee bent. Complete this exercise __________ times a day. Balance exercise This exercise builds your balance and strength control of your arch to help take pressure off your plantar fascia. Exercise E: Single leg stand 1. Without shoes, stand near a railing or in a doorway. You may hold onto the railing or door frame as needed. 2. Stand on your left / right foot. Keep your big toe down on the floor and try to keep your arch lifted. Do not let your foot roll inward. 3. Hold   this position for __________ seconds. 4. If this exercise is too easy, you can try it with your eyes closed or while standing on a pillow. Repeat __________ times. Complete this exercise __________ times a day. This information is not intended to replace advice given to you by your health care provider. Make sure you discuss any questions you have with your health care provider. Document Released: 04/04/2005 Document Revised: 12/08/2015 Document Reviewed: 02/16/2015 Elsevier Interactive Patient Education  2018 Elsevier Inc.   

## 2017-01-06 ENCOUNTER — Telehealth: Payer: Self-pay | Admitting: Rheumatology

## 2017-01-06 MED ORDER — METHOTREXATE 2.5 MG PO TABS: ORAL_TABLET | ORAL | 0 refills | Status: DC

## 2017-01-06 NOTE — Telephone Encounter (Signed)
Patient needs a refill on MTX for 90 day supply. Patient uses [facility].

## 2017-01-06 NOTE — Telephone Encounter (Signed)
Last Visit: 12/07/16 Next Visit: 05/16/17 Labs: 10/26/16 WNL  Okay to refill per Dr. Corliss Skains

## 2017-01-11 ENCOUNTER — Telehealth: Payer: Self-pay

## 2017-01-11 MED ORDER — ADALIMUMAB 40 MG/0.8ML ~~LOC~~ AJKT: 40.0000 mg | AUTO-INJECTOR | SUBCUTANEOUS | 0 refills | Status: DC

## 2017-01-11 NOTE — Telephone Encounter (Signed)
Last Visit: 12/07/16 Next Visit: 05/16/16  Labs: 10/26/16 WNL Chest X-ray 09/28/2016 WNL  Okay to refill per Dr. Corliss Skains

## 2017-01-11 NOTE — Telephone Encounter (Signed)
Received a prior authorization request for Humira via cover my meds. Authorization was submitted and approved from 12/12/2016 through 01/11/2020.   Case ID: [id number]  Will send document once received.   Called patient to update him. He states that he is out of refills at the specialty pharmacy. He is due for his next injection on Friday, 9/28. Can we send in a new Rx for him today so that he can get his medication on time? Thanks!  Adilson Grafton, [place], CPhT 12:15 PM

## 2017-01-31 ENCOUNTER — Other Ambulatory Visit: Payer: Self-pay

## 2017-01-31 DIAGNOSIS — Z79899 Other long term (current) drug therapy: Secondary | ICD-10-CM

## 2017-01-31 LAB — CBC WITH DIFFERENTIAL/PLATELET
BASOS ABS: 67 {cells}/uL (ref 0–200)
BASOS PCT: 1.1 %
EOS ABS: 110 {cells}/uL (ref 15–500)
Eosinophils Relative: 1.8 %
HCT: 40.6 % (ref 38.5–50.0)
Hemoglobin: 14 g/dL (ref 13.2–17.1)
Lymphs Abs: 2050 cells/uL (ref 850–3900)
MCH: 29.9 pg (ref 27.0–33.0)
MCHC: 34.5 g/dL (ref 32.0–36.0)
MCV: 86.8 fL (ref 80.0–100.0)
MONOS PCT: 10.5 %
MPV: 11.7 fL (ref 7.5–12.5)
Neutro Abs: 3233 cells/uL (ref 1500–7800)
Neutrophils Relative %: 53 %
Platelets: 248 10*3/uL (ref 140–400)
RBC: 4.68 10*6/uL (ref 4.20–5.80)
RDW: 13.8 % (ref 11.0–15.0)
Total Lymphocyte: 33.6 %
WBC: 6.1 10*3/uL (ref 3.8–10.8)
WBCMIX: 641 {cells}/uL (ref 200–950)

## 2017-01-31 LAB — COMPLETE METABOLIC PANEL WITH GFR
AG Ratio: 1.6 (calc) (ref 1.0–2.5)
ALBUMIN MSPROF: 4.5 g/dL (ref 3.6–5.1)
ALKALINE PHOSPHATASE (APISO): 44 U/L (ref 40–115)
ALT: 32 U/L (ref 9–46)
AST: 21 U/L (ref 10–40)
BILIRUBIN TOTAL: 0.8 mg/dL (ref 0.2–1.2)
BUN: 11 mg/dL (ref 7–25)
CHLORIDE: 104 mmol/L (ref 98–110)
CO2: 27 mmol/L (ref 20–32)
Calcium: 9.7 mg/dL (ref 8.6–10.3)
Creat: 0.86 mg/dL (ref 0.60–1.35)
GFR, EST AFRICAN AMERICAN: 129 mL/min/{1.73_m2} (ref >=60)
GFR, Est Non African American: 112 mL/min/{1.73_m2} (ref >=60)
Globulin: 2.8 g/dL (calc) (ref 1.9–3.7)
Glucose, Bld: 104 mg/dL — ABNORMAL HIGH (ref 65–99)
Potassium: 4.8 mmol/L (ref 3.5–5.3)
SODIUM: 139 mmol/L (ref 135–146)
TOTAL PROTEIN: 7.3 g/dL (ref 6.1–8.1)

## 2017-01-31 NOTE — Telephone Encounter (Signed)
Patient would like a Rx refill on Humira Pen.  Cb# [phone].  Please advise.  Thank You.

## 2017-01-31 NOTE — Telephone Encounter (Signed)
Last visit: 12/07/16 Next visit: 05/16/17 Labs: 10/26/16 WNL   Patient advised he is due for labs, he states he will be in this afternoon to have labs drawn.   Ok to refill per Dr. Corliss Skains.

## 2017-02-01 NOTE — Telephone Encounter (Signed)
Labs: 01/31/17 WNL Chest X-ray 09/2016 Neg   Okay to refill Humira per Dr. Corliss Skainseveshwar

## 2017-02-01 NOTE — Progress Notes (Signed)
WNL

## 2017-02-01 NOTE — Addendum Note (Signed)
Addended by: Henriette CombsHATTON, Phyliss Hulick L on: 02/01/2017 08:24 AM   Modules accepted: Orders

## 2017-02-02 MED ORDER — ADALIMUMAB 40 MG/0.8ML ~~LOC~~ AJKT: 40.0000 mg | AUTO-INJECTOR | SUBCUTANEOUS | 0 refills | Status: DC

## 2017-02-02 NOTE — Addendum Note (Signed)
Addended by: Henriette CombsHATTON, Esmond Hinch L on: 02/02/2017 09:41 AM   Modules accepted: Orders

## 2017-04-24 ENCOUNTER — Other Ambulatory Visit: Payer: Self-pay | Admitting: *Deleted

## 2017-04-24 MED ORDER — ADALIMUMAB 40 MG/0.8ML ~~LOC~~ AJKT: 40.0000 mg | AUTO-INJECTOR | SUBCUTANEOUS | 0 refills | Status: DC

## 2017-04-24 NOTE — Telephone Encounter (Signed)
Refill request received via fax  Last visit: 12/07/16 Next visit: 05/16/17 Labs: 01/31/17 WNL Chest X-ray 09/2016 Neg   Okay to refill per Dr. Corliss Skainseveshwar

## 2017-04-25 ENCOUNTER — Other Ambulatory Visit: Payer: Self-pay | Admitting: *Deleted

## 2017-04-25 ENCOUNTER — Telehealth: Payer: Self-pay | Admitting: Rheumatology

## 2017-04-25 DIAGNOSIS — Z79899 Other long term (current) drug therapy: Secondary | ICD-10-CM

## 2017-04-25 LAB — COMPLETE METABOLIC PANEL WITH GFR
AG Ratio: 1.5 (calc) (ref 1.0–2.5)
ALBUMIN MSPROF: 4.4 g/dL (ref 3.6–5.1)
ALKALINE PHOSPHATASE (APISO): 41 U/L (ref 40–115)
ALT: 27 U/L (ref 9–46)
AST: 18 U/L (ref 10–40)
BUN: 11 mg/dL (ref 7–25)
CALCIUM: 9.5 mg/dL (ref 8.6–10.3)
CO2: 27 mmol/L (ref 20–32)
CREATININE: 0.83 mg/dL (ref 0.60–1.35)
Chloride: 105 mmol/L (ref 98–110)
GFR, EST NON AFRICAN AMERICAN: 113 mL/min/{1.73_m2} (ref >=60)
GFR, Est African American: 131 mL/min/{1.73_m2} (ref >=60)
Globulin: 2.9 g/dL (calc) (ref 1.9–3.7)
Glucose, Bld: 90 mg/dL (ref 65–99)
Potassium: 3.9 mmol/L (ref 3.5–5.3)
SODIUM: 139 mmol/L (ref 135–146)
Total Bilirubin: 0.9 mg/dL (ref 0.2–1.2)
Total Protein: 7.3 g/dL (ref 6.1–8.1)

## 2017-04-25 LAB — CBC WITH DIFFERENTIAL/PLATELET
Basophils Absolute: 83 cells/uL (ref 0–200)
Basophils Relative: 1.2 %
EOS ABS: 90 {cells}/uL (ref 15–500)
Eosinophils Relative: 1.3 %
HCT: 40.4 % (ref 38.5–50.0)
Hemoglobin: 14.1 g/dL (ref 13.2–17.1)
Lymphs Abs: 2505 cells/uL (ref 850–3900)
MCH: 30 pg (ref 27.0–33.0)
MCHC: 34.9 g/dL (ref 32.0–36.0)
MCV: 86 fL (ref 80.0–100.0)
MPV: 11.8 fL (ref 7.5–12.5)
Monocytes Relative: 7.7 %
NEUTROS PCT: 53.5 %
Neutro Abs: 3692 cells/uL (ref 1500–7800)
PLATELETS: 268 10*3/uL (ref 140–400)
RBC: 4.7 10*6/uL (ref 4.20–5.80)
RDW: 13.7 % (ref 11.0–15.0)
TOTAL LYMPHOCYTE: 36.3 %
WBC: 6.9 10*3/uL (ref 3.8–10.8)
WBCMIX: 531 {cells}/uL (ref 200–950)

## 2017-04-25 MED ORDER — METHOTREXATE 2.5 MG PO TABS: ORAL_TABLET | ORAL | 0 refills | Status: DC

## 2017-04-25 NOTE — Telephone Encounter (Signed)
Last visit: 12/07/16 Next visit: 05/16/17 Labs: 01/31/17 WNL  Okay to refill per Dr. Sherrine Mapleseveshwar  Humira sent to pharmacy 04/24/17

## 2017-04-25 NOTE — Telephone Encounter (Signed)
Patient needs refill on MTX and Hurima for 3 months. Patient uses [facility] for MTX, and [facility] for Hurima. Patient doing labs today.

## 2017-04-26 NOTE — Progress Notes (Signed)
WNLs

## 2017-04-27 ENCOUNTER — Telehealth: Payer: Self-pay | Admitting: Rheumatology

## 2017-04-27 NOTE — Telephone Encounter (Signed)
Patient advised of lab results and verbalized understanding. Patient advised prescription for Humira was sent to the pharmacy on 04/24/17. Patient will contact the pharmacy for delivery. Patient will contact the office if he needs a sample.

## 2017-04-27 NOTE — Telephone Encounter (Signed)
Patient is due for Humira, and MTX. Patient requesting lab results from labs two days ago. Patient due for Humira tomorrow, and completely out. Please call to advise.

## 2017-05-02 NOTE — Progress Notes (Signed)
Office Visit Note  Patient: Colton Anthony Treu             Date of Birth: 10/13/1980           MRN: [id number]             PCP: Patient, No Pcp Per Referring: No ref. provider found Visit Date: 05/16/2017 Occupation: @GUAROCC @    Subjective:  Medication monitoring    History of Present Illness: Colton Anthony Felipe is a 37 y.o. male with history of psoriatic arthritis.  Patient states he is taking MTX 6 tablets weekly, folic acid 2 mg daily, and Humira every other week.  He states this combination has been controlling his disease.  He denies any joint pain or joint swelling.  He denies any joint stiffness.  He states his plantar fasciitis has resolved after wearing insoles in his shoes.  He denies any SI joint pain or achilles tendonitis.  He denies any psoriasis.  He reports getting labs on 04/25/17.  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  His last CXR was June 2018.   He is currently holding Humira as he had some dental infection after tooth extraction. He is currently taking antibiotics.  Activities of Daily Living:  Patient reports morning stiffness for 0 minutes.   Patient Denies nocturnal pain.  Difficulty dressing/grooming: Denies Difficulty climbing stairs: Denies Difficulty getting out of chair: Denies Difficulty using hands for taps, buttons, cutlery, and/or writing: Denies   Review of Systems  Constitutional: Negative for fatigue, night sweats and weakness.  HENT: Negative for mouth sores, mouth dryness and nose dryness.   Eyes: Negative for redness, visual disturbance and dryness.  Respiratory: Negative for cough, hemoptysis, shortness of breath and difficulty breathing.   Cardiovascular: Negative for chest pain, palpitations, hypertension, irregular heartbeat and swelling in legs/feet.  Gastrointestinal: Negative for blood in stool, constipation and diarrhea.  Endocrine: Negative for increased urination.  Genitourinary:  Negative for painful urination.  Musculoskeletal: Negative for arthralgias, joint pain, joint swelling, myalgias, muscle weakness, morning stiffness, muscle tenderness and myalgias.  Skin: Negative for color change, pallor, rash, hair loss, nodules/bumps, redness, skin tightness, ulcers and sensitivity to sunlight.  Allergic/Immunologic: Negative for susceptible to infections.  Neurological: Negative for dizziness, fainting, memory loss and night sweats.  Hematological: Negative for swollen glands.  Psychiatric/Behavioral: Negative for depressed mood and sleep disturbance. The patient is not nervous/anxious.     PMFS History:  Patient Active Problem List   Diagnosis Date Noted  . Right hip pain 07/05/2016  . 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 Other psoriasis 03/29/2016  . 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 High risk medication use 03/29/2016  . Elevated LFTs 03/29/2016    Past Medical History:  Diagnosis Date  . Arthritis    Psoriatic arthritis   . Psoriasis     Family History  Problem Relation Age of Onset  . Arthritis Mother    History reviewed. No pertinent surgical history. Social History   Social History Narrative  . Not on file     Objective: Vital Signs: BP 102/69 (BP Location: Left Arm, Patient Position: Sitting, Cuff Size: Normal)   Pulse (!) 58   Resp 14   Ht 5\' 10"  (1.778 m)   Wt 182 lb (82.6 kg)   BMI 26.11 kg/m    Physical Exam  Constitutional: He is oriented to person, place, and time. He appears well-developed and well-nourished.  HENT:  Head: Normocephalic and atraumatic.  Eyes: Conjunctivae and EOM are normal. Pupils are equal, round, and reactive to light.  Neck: Normal range of motion. Neck supple.  Cardiovascular: Normal rate, regular rhythm and normal heart sounds.  Pulmonary/Chest: Effort normal and breath sounds normal.  Abdominal: Soft. Bowel sounds are normal.  Neurological: He is alert and oriented to  person, place, and time.  Skin: Skin is warm and dry. Capillary refill takes less than 2 seconds.  No active psoriasis patches  Psychiatric: He has a normal mood and affect. His behavior is normal.  Nursing note and vitals reviewed.    Musculoskeletal Exam: C-spine, thoracic, and lumbar spine good ROM.  Shoulder joints, elbow joints, wrist joints, MCPs good ROM with no synovitis.  He has PIP and DIP synovial thickening. Hip joints, knee joints, ankle joints, MTPs, PIPs, and DIPs good ROM with no synovitis.  He has no SI joint tenderness.  No plantar fasciitis or achilles tendonitis.  No midline spinal tenderness.  No trochanteric bursitis.        CDAI Exam: CDAI Homunculus Exam:   Joint Counts:  CDAI Tender Joint count: 0 CDAI Swollen Joint count: 0  Global Assessments:  Patient Global Assessment: 1 Provider Global Assessment: 1  CDAI Calculated Score: 2    Investigation: No additional findings. Chest xray: 09/28/2016 CBC Latest Ref Rng & Units 04/25/2017 01/31/2017 10/26/2016  WBC 3.8 - 10.8 Thousand/uL 6.9 6.1 5.2  Hemoglobin 13.2 - 17.1 g/dL 40.9 81.1 91.4  Hematocrit 38.5 - 50.0 % 40.4 40.6 43.7  Platelets 140 - 400 Thousand/uL 268 248 232   CMP Latest Ref Rng & Units 04/25/2017 01/31/2017 10/26/2016  Glucose 65 - 99 mg/dL 90 782(N) 86  BUN 7 - 25 mg/dL 11 11 10   Creatinine 0.60 - 1.35 mg/dL 5.62 1.30 8.65  Sodium 135 - 146 mmol/L 139 139 137  Potassium 3.5 - 5.3 mmol/L 3.9 4.8 5.0  Chloride 98 - 110 mmol/L 105 104 102  CO2 20 - 32 mmol/L 27 27 24   Calcium 8.6 - 10.3 mg/dL 9.5 9.7 9.9  Total Protein 6.1 - 8.1 g/dL 7.3 7.3 7.3  Total Bilirubin 0.2 - 1.2 mg/dL 0.9 0.8 1.2  Alkaline Phos 40 - 115 U/L - - 43  AST 10 - 40 U/L 18 21 24   ALT 9 - 46 U/L 27 32 34    Imaging: Xr Foot 2 Views Left  Result Date: 05/16/2017 PIP/DIP minimal narrowing was noted. No erosive changes were noted.No intertarsal joint or tibiotalar joint space narrowing was noted. 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  Xr Foot 2 Views Right  Result Date: 05/16/2017 PIP/DIP minimal narrowing was noted. No erosive changes were noted.No intertarsal joint or tibiotalar joint space narrowing was noted. 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  Xr Hand 2 View Left  Result Date: 05/16/2017 No MCP joint narrowing or erosive changes were noted. Third DIP severe narrowing and erosive changes were noted. Intercarpal and radiocarpal joint space narrowing were noted. Juxta articular osteopenia was noted. Impression: These findings are consistent with psoriatic arthritis.  Xr Hand 2 View Right  Result Date: 05/16/2017 No MCP joint narrowing was noted. Second PIP narrowing and erosive changes were noted. First DIP severe narrowing and erosive changes were noted. Mild intercarpal joint space narrowing was noted.juxta articular osteopenia was noted. Impression: These findings are consistent with psoriatic arthritis.   Speciality Comments: No specialty comments available.  Procedures:  No procedures performed Allergies: Patient has no known allergies.   Assessment / Plan:     Visit Diagnoses: Psoriatic arthritis (HCC) - erosive, H/O dactylitis, TMJ arthritis: He has no synovitis or dactylitis on exam.  He has no tenderness or discomfort of his joints.  He has no joint stiffness.  He will continue on MTX 6 tablets weekly, folic acid 2 mg daily, and Humira every other week.  Labs on 04/26/17 were WNL.  Chest x-rays will be performed yearly due to history of treated latent TB.  Next CXR will be due in June 2019. X-rays of his hands and feet were performed today due to history of erosive disease.- Plan: XR Foot 2 Views Right, XR Foot 2 Views Left, XR Hand 2 View Left, XR Hand 2 View Right. The x-rays today did not show any progression from his previous x-rays.  Other psoriasis: No active psoriasis.  He uses Clobetasol  PRN.  High risk medication use - Humira, MTX, folic acid, Chest xray: 09/28/2016.  He will require a repeat CXR in June 2019.  CBC and CMP will be drawn in March and every 3 months.  Standing labs are in place.    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    Orders: Orders Placed This Encounter  Procedures  . XR Foot 2 Views Right  . XR Foot 2 Views Left  . XR Hand 2 View Left  . XR Hand 2 View Right  . DG Chest 2 View   No orders of the defined types were placed in this encounter.   Follow-Up Instructions: Return in about 5 months (around 10/14/2017) for Psoriatic arthritis.   Pollyann Savoy, MD  Note - This record has been created using Animal nutritionist.  Chart creation errors have been sought, but may not always  have been located. Such creation errors do not reflect on  the standard of medical care.

## 2017-05-16 ENCOUNTER — Ambulatory Visit (INDEPENDENT_AMBULATORY_CARE_PROVIDER_SITE_OTHER): Payer: 59

## 2017-05-16 ENCOUNTER — Ambulatory Visit (INDEPENDENT_AMBULATORY_CARE_PROVIDER_SITE_OTHER): Payer: Self-pay

## 2017-05-16 ENCOUNTER — Ambulatory Visit: Payer: 59 | Admitting: Rheumatology

## 2017-05-16 ENCOUNTER — Encounter: Payer: Self-pay | Admitting: Rheumatology

## 2017-05-16 VITALS — BP 102/69 | HR 58 | Resp 14 | Ht 70.0 in | Wt 182.0 lb

## 2017-05-16 DIAGNOSIS — L4050 Arthropathic psoriasis, unspecified: Secondary | ICD-10-CM

## 2017-05-16 DIAGNOSIS — R7612 Nonspecific reaction to cell mediated immunity measurement of gamma interferon antigen response without active tuberculosis: Secondary | ICD-10-CM

## 2017-05-16 DIAGNOSIS — L408 Other psoriasis: Secondary | ICD-10-CM | POA: Diagnosis not present

## 2017-05-16 DIAGNOSIS — Z79899 Other long term (current) drug therapy: Secondary | ICD-10-CM | POA: Diagnosis not present

## 2017-05-16 NOTE — Patient Instructions (Addendum)
Standing Labs We placed an order today for your standing lab work.    Please come back and get your standing labs in April and every 3 months  We have open lab Monday through Friday from 8:30-11:30 AM and 1:30-4 PM at the office of Dr. Pollyann SavoyShaili Deveshwar.   The office is located at [address], [address], [place], [place] [address] No appointment is necessary.   Labs are drawn by [facility].  You may receive a bill from [facility] for your lab work. If you have any questions regarding directions or hours of operation,  please call [phone].    Your chest x-ray is due in June [facility].

## 2017-08-04 ENCOUNTER — Other Ambulatory Visit: Payer: Self-pay | Admitting: Rheumatology

## 2017-08-07 NOTE — Telephone Encounter (Addendum)
Last Visit: 05/16/17 Next Visit: 11/14/17 Labs: 04/25/17 WNL Chest x-ray 09/28/16 WNL  Patient advised he is due for labs. Patient will update this week.   Okay to refill 30 day supply per Dr. Corliss Skainseveshwar

## 2017-08-08 ENCOUNTER — Other Ambulatory Visit: Payer: Self-pay

## 2017-08-08 DIAGNOSIS — Z79899 Other long term (current) drug therapy: Secondary | ICD-10-CM

## 2017-08-08 LAB — CBC WITH DIFFERENTIAL/PLATELET
BASOS PCT: 1.3 %
Basophils Absolute: 81 cells/uL (ref 0–200)
EOS PCT: 1.5 %
Eosinophils Absolute: 93 cells/uL (ref 15–500)
HEMATOCRIT: 41.4 % (ref 38.5–50.0)
Hemoglobin: 14.2 g/dL (ref 13.2–17.1)
LYMPHS ABS: 2610 {cells}/uL (ref 850–3900)
MCH: 29.1 pg (ref 27.0–33.0)
MCHC: 34.3 g/dL (ref 32.0–36.0)
MCV: 84.8 fL (ref 80.0–100.0)
MPV: 11.4 fL (ref 7.5–12.5)
Monocytes Relative: 9.1 %
Neutro Abs: 2852 cells/uL (ref 1500–7800)
Neutrophils Relative %: 46 %
PLATELETS: 269 10*3/uL (ref 140–400)
RBC: 4.88 10*6/uL (ref 4.20–5.80)
RDW: 13.8 % (ref 11.0–15.0)
TOTAL LYMPHOCYTE: 42.1 %
WBC: 6.2 10*3/uL (ref 3.8–10.8)
WBCMIX: 564 {cells}/uL (ref 200–950)

## 2017-08-08 LAB — COMPLETE METABOLIC PANEL WITH GFR
AG RATIO: 1.4 (calc) (ref 1.0–2.5)
ALT: 31 U/L (ref 9–46)
AST: 21 U/L (ref 10–40)
Albumin: 4.4 g/dL (ref 3.6–5.1)
Alkaline phosphatase (APISO): 49 U/L (ref 40–115)
BILIRUBIN TOTAL: 1 mg/dL (ref 0.2–1.2)
BUN: 12 mg/dL (ref 7–25)
CALCIUM: 9.9 mg/dL (ref 8.6–10.3)
CHLORIDE: 104 mmol/L (ref 98–110)
CO2: 31 mmol/L (ref 20–32)
CREATININE: 0.85 mg/dL (ref 0.60–1.35)
GFR, Est African American: 130 mL/min/{1.73_m2} (ref >=60)
GFR, Est Non African American: 112 mL/min/{1.73_m2} (ref >=60)
GLOBULIN: 3.1 g/dL (ref 1.9–3.7)
Glucose, Bld: 88 mg/dL (ref 65–99)
Potassium: 4.3 mmol/L (ref 3.5–5.3)
Sodium: 140 mmol/L (ref 135–146)
TOTAL PROTEIN: 7.5 g/dL (ref 6.1–8.1)

## 2017-08-28 ENCOUNTER — Other Ambulatory Visit: Payer: Self-pay | Admitting: Rheumatology

## 2017-08-29 MED ORDER — FOLIC ACID 1 MG PO TABS: 2.0000 mg | ORAL_TABLET | Freq: Every day | ORAL | 4 refills | Status: AC

## 2017-08-29 NOTE — Telephone Encounter (Signed)
Last Visit: 05/16/17 Next Visit: 11/14/17 Labs: 08/08/17 WNL  Okay to refill per Dr. Corliss Skains

## 2017-10-03 ENCOUNTER — Other Ambulatory Visit: Payer: Self-pay | Admitting: *Deleted

## 2017-10-03 MED ORDER — ADALIMUMAB 40 MG/0.8ML ~~LOC~~ AJKT: 40.0000 mg | AUTO-INJECTOR | SUBCUTANEOUS | 0 refills | Status: DC

## 2017-10-03 NOTE — Telephone Encounter (Signed)
Refill request received via fax  Last Visit: 05/16/17 Next Visit: 11/14/17 Labs: 08/08/17 WNL Chest X-ray 09/28/16 WNL  Patient advised he is due to update chest x-ray  Okay to refill per Dr. Corliss Skainseveshwar

## 2017-10-09 ENCOUNTER — Ambulatory Visit (HOSPITAL_COMMUNITY)
Admission: RE | Admit: 2017-10-09 | Discharge: 2017-10-09 | Disposition: A | Discharge status: Home / Self Care | Payer: 59 | Source: Ambulatory Visit | Attending: Rheumatology | Admitting: Rheumatology

## 2017-10-09 DIAGNOSIS — Z79899 Other long term (current) drug therapy: Secondary | ICD-10-CM | POA: Diagnosis present

## 2017-10-09 NOTE — Progress Notes (Signed)
CXR normal.

## 2017-10-31 NOTE — Progress Notes (Signed)
Office Visit Note  Patient: Colton Anthony             Date of Birth: 05-Apr-1981           MRN: [id number]             PCP: [facility] Referring: No ref. provider found Visit Date: 11/14/2017 Occupation: @GUAROCC @  Subjective:  Medication Management   History of Present Illness: Colton Anthony is a 37 y.o. male with history of psoriatic arthritis and psoriasis.  He states he has been doing quite well on combination therapy of Humira and methotrexate.  He denies any joint pain or joint swelling.  He denies any psoriasis.  Activities of Daily Living:  Patient reports morning stiffness for 0 minutes.   Patient Denies nocturnal pain.  Difficulty dressing/grooming: Denies Difficulty climbing stairs: Denies Difficulty getting out of chair: Denies Difficulty using hands for taps, buttons, cutlery, and/or writing: Denies  Review of Systems  Constitutional: Negative for fatigue.  HENT: Negative for mouth dryness.   Eyes: Negative for dryness.  Respiratory: Negative for shortness of breath and difficulty breathing.   Cardiovascular: Negative for chest pain and swelling in legs/feet.  Gastrointestinal: Negative for abdominal pain, constipation and diarrhea.  Endocrine: Negative for increased urination.  Genitourinary: Negative for painful urination.  Musculoskeletal: Negative for arthralgias, joint pain, joint swelling and morning stiffness.  Skin: Negative for rash.  Allergic/Immunologic: Negative for susceptible to infections.  Neurological: Negative for dizziness, headaches, memory loss and weakness.  Hematological: Negative for bruising/bleeding tendency.  Psychiatric/Behavioral: Negative for confusion.    PMFS History:  Patient Active Problem List   Diagnosis Date Noted  . Right hip pain 07/05/2016  . 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 Other psoriasis 03/29/2016  . 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 High risk  medication use 03/29/2016  . Elevated LFTs 03/29/2016    Past Medical History:  Diagnosis Date  . Arthritis    Psoriatic arthritis   . Psoriasis     Family History  Problem Relation Age of Onset  . Arthritis Mother    History reviewed. No pertinent surgical history. Social History   Social History Narrative  . Not on file    Objective: Vital Signs: BP 105/70 (BP Location: Left Arm, Patient Position: Sitting, Cuff Size: Normal)   Pulse (!) 52   Resp 14   Ht 5\' 10"  (1.778 m)   Wt 178 lb (80.7 kg)   BMI 25.54 kg/m    Physical Exam  Constitutional: He is oriented to person, place, and time. He appears well-developed and well-nourished.  HENT:  Head: Normocephalic and atraumatic.  Eyes: Pupils are equal, round, and reactive to light. Conjunctivae and EOM are normal.  Neck: Normal range of motion. Neck supple.  Cardiovascular: Normal rate, regular rhythm and normal heart sounds.  Pulmonary/Chest: Effort normal and breath sounds normal.  Abdominal: Soft. Bowel sounds are normal.  Neurological: He is alert and oriented to person, place, and time.  Skin: Skin is warm and dry. Capillary refill takes less than 2 seconds.  Psychiatric: He has a normal mood and affect. His behavior is normal.  Nursing note and vitals reviewed.    Musculoskeletal Exam: C-spine thoracic lumbar spine good range of motion.  He had no SI joint tenderness.  Shoulder joints elbow joints wrist joints were in good range of motion.  He had no flexion in his right second PIP joint which is thickened.  He has second finger distal  digit shortening and flexion of the DIP joint.  He also has hyperextension of his left fourth PIP joint.  No synovitis was noted.  Hip joints knee joints, ankles, MTPs and PIPs were in color good range of motion with no synovitis.  CDAI Exam: CDAI Homunculus Exam:   Joint Counts:  CDAI Tender Joint count: 0 CDAI Swollen Joint count: 0  Global Assessments:  Patient Global  Assessment: 1 Provider Global Assessment: 1  CDAI Calculated Score: 2   Investigation: No additional findings.  Imaging: No results found.  Recent Labs: Lab Results  Component Value Date   WBC 6.2 08/08/2017   HGB 14.2 08/08/2017   PLT 269 08/08/2017   NA 140 08/08/2017   K 4.3 08/08/2017   CL 104 08/08/2017   CO2 31 08/08/2017   GLUCOSE 88 08/08/2017   BUN 12 08/08/2017   CREATININE 0.85 08/08/2017   BILITOT 1.0 08/08/2017   ALKPHOS 43 10/26/2016   AST 21 08/08/2017   ALT 31 08/08/2017   PROT 7.5 08/08/2017   ALBUMIN 4.4 10/26/2016   CALCIUM 9.9 08/08/2017   GFRAA 130 08/08/2017   October 09, 2017 chest x-ray normal Speciality Comments: No specialty comments available.  Procedures:  No procedures performed Allergies: Patient has no known allergies.   Assessment / Plan:     Visit Diagnoses: Psoriatic arthritis (HCC) - erosive, H/O dactylitis, TMJ arthritis.  He had active severe disease in the beginning which is quite well controlled.  He has appears to be in remission.  We discussed up his spacing Humira to every 3 weeks if tolerated.  If he does develop increased pain and stiffness he should go back to every 2 weeks interval.  Other psoriasis -  He uses Clobetasol PRN.  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    Orders: Orders Placed This Encounter  Procedures  . CBC with Differential/Platelet  . COMPLETE METABOLIC PANEL WITH GFR   No orders of the defined types were placed in this encounter.   Face-to-face time spent with patient was 20 minutes. Greater than 50% of time was spent in counseling and coordination of care.  Follow-Up Instructions: Return in about 5 months (around 04/16/2018) for Psoriatic arthritis, Psoriasis.   Pollyann SavoyShaili Adisen Bennion, MD  Note - This  record has been created using Animal nutritionistDragon software.  Chart creation errors have been sought, but may not always  have been located. Such creation errors do not reflect on  the standard of medical care.

## 2017-11-14 ENCOUNTER — Encounter: Payer: Self-pay | Admitting: Rheumatology

## 2017-11-14 ENCOUNTER — Ambulatory Visit: Payer: 59 | Admitting: Rheumatology

## 2017-11-14 VITALS — BP 105/70 | HR 52 | Resp 14 | Ht 70.0 in | Wt 178.0 lb

## 2017-11-14 DIAGNOSIS — L4050 Arthropathic psoriasis, unspecified: Secondary | ICD-10-CM

## 2017-11-14 DIAGNOSIS — R7612 Nonspecific reaction to cell mediated immunity measurement of gamma interferon antigen response without active tuberculosis: Secondary | ICD-10-CM | POA: Diagnosis not present

## 2017-11-14 DIAGNOSIS — Z79899 Other long term (current) drug therapy: Secondary | ICD-10-CM | POA: Diagnosis not present

## 2017-11-14 DIAGNOSIS — L408 Other psoriasis: Secondary | ICD-10-CM | POA: Diagnosis not present

## 2017-11-14 LAB — CBC WITH DIFFERENTIAL/PLATELET
BASOS ABS: 59 {cells}/uL (ref 0–200)
Basophils Relative: 1.1 %
Eosinophils Absolute: 119 cells/uL (ref 15–500)
Eosinophils Relative: 2.2 %
HEMATOCRIT: 42.9 % (ref 38.5–50.0)
Hemoglobin: 14.7 g/dL (ref 13.2–17.1)
LYMPHS ABS: 2187 {cells}/uL (ref 850–3900)
MCH: 29.5 pg (ref 27.0–33.0)
MCHC: 34.3 g/dL (ref 32.0–36.0)
MCV: 86.1 fL (ref 80.0–100.0)
MONOS PCT: 9 %
MPV: 10.9 fL (ref 7.5–12.5)
NEUTROS ABS: 2549 {cells}/uL (ref 1500–7800)
NEUTROS PCT: 47.2 %
Platelets: 252 10*3/uL (ref 140–400)
RBC: 4.98 10*6/uL (ref 4.20–5.80)
RDW: 14.4 % (ref 11.0–15.0)
Total Lymphocyte: 40.5 %
WBC mixed population: 486 cells/uL (ref 200–950)
WBC: 5.4 10*3/uL (ref 3.8–10.8)

## 2017-11-14 LAB — COMPLETE METABOLIC PANEL WITH GFR
AG RATIO: 1.6 (calc) (ref 1.0–2.5)
ALKALINE PHOSPHATASE (APISO): 49 U/L (ref 40–115)
ALT: 41 U/L (ref 9–46)
AST: 23 U/L (ref 10–40)
Albumin: 4.6 g/dL (ref 3.6–5.1)
BILIRUBIN TOTAL: 1 mg/dL (ref 0.2–1.2)
BUN: 9 mg/dL (ref 7–25)
CHLORIDE: 106 mmol/L (ref 98–110)
CO2: 25 mmol/L (ref 20–32)
Calcium: 9.7 mg/dL (ref 8.6–10.3)
Creat: 0.82 mg/dL (ref 0.60–1.35)
GFR, Est African American: 132 mL/min/{1.73_m2} (ref >=60)
GFR, Est Non African American: 114 mL/min/{1.73_m2} (ref >=60)
GLUCOSE: 89 mg/dL (ref 65–99)
Globulin: 2.9 g/dL (calc) (ref 1.9–3.7)
POTASSIUM: 4.2 mmol/L (ref 3.5–5.3)
Sodium: 139 mmol/L (ref 135–146)
Total Protein: 7.5 g/dL (ref 6.1–8.1)

## 2017-11-14 NOTE — Patient Instructions (Addendum)
Standing Labs We placed an order today for your standing lab work.   Please come back and get your standing labs in October and every 3 months   We have open lab Monday through Friday from 8:30-11:30 AM and 1:30-4:00 PM  at the office of Dr. Terryon Pineiro.   You may experience shorter wait times on Monday and Friday afternoons. The office is located at [address], [address], [place], [place] [address] No appointment is necessary.   Labs are drawn by [facility].  You may receive a bill from [facility] for your lab work. If you have any questions regarding directions or hours of operation,  please call [phone].    

## 2017-12-21 ENCOUNTER — Other Ambulatory Visit: Payer: Self-pay | Admitting: Rheumatology

## 2017-12-22 NOTE — Telephone Encounter (Signed)
Last Visit: 11/14/17 Next visit: 05/01/18 Labs: 11/14/17 WNL  Okay to refill per Dr. Corliss Skains

## 2018-01-04 ENCOUNTER — Telehealth: Payer: Self-pay | Admitting: Rheumatology

## 2018-01-04 NOTE — Telephone Encounter (Signed)
Patient called stating that [place] will [place] leaving the [place] and moving to [place].  Patient states he talked to Dr. Corliss Skainseveshwar at his appointment on 11/14/17 that he needs a letter to take with him to show that he is a current patient taking Humira.   Patient states Dr. Corliss Skainseveshwar told him "that she would help him out."  Patient needs to come by the office ASAP to pick up the letter.  Patient states that he also takes Methotrexate, but currently has a 3 month supply so is not as worried about that prescription.  Patient requested a return call.

## 2018-01-04 NOTE — Telephone Encounter (Signed)
I spoke with patient.  I have advised him to come in tomorrow to sign release of medical records and pick up medical records.

## 2018-01-05 ENCOUNTER — Encounter: Payer: Self-pay | Admitting: *Deleted

## 2018-01-05 MED ORDER — ADALIMUMAB 40 MG/0.8ML ~~LOC~~ AJKT: 40.0000 mg | AUTO-INJECTOR | SUBCUTANEOUS | 0 refills | Status: DC

## 2018-01-05 NOTE — Telephone Encounter (Signed)
Please cancel his upcoming visit if he is moving to [place]. Thank you.

## 2018-01-05 NOTE — Telephone Encounter (Signed)
Appointment cancelled

## 2018-01-05 NOTE — Telephone Encounter (Signed)
Last Visit: 11/14/17 Next visit: 05/01/18 Labs: 11/14/17 WNL Chest X-ray 10/09/17 WNL  Okay to refill per Dr. Renea Eeeveshwar  Contacted pharmacy and they are processing prescription and will call today or tomorrow to set up shipment.

## 2018-05-01 ENCOUNTER — Ambulatory Visit: Payer: 59 | Admitting: Rheumatology

## 2018-06-13 NOTE — Progress Notes (Deleted)
   Office Visit Note  Patient: Colton Anthony             Date of Birth: 1980/06/04           MRN: [id number]             PCP: [facility] Referring: [facility] Visit Date: 06/27/2018 Occupation: @GUAROCC @  Subjective:  No chief complaint on file.   History of Present Illness: Colton Anthony is a 38 y.o. male ***   Activities of Daily Living:  Patient reports morning stiffness for *** {minute/hour:19697}.   Patient {ACTIONS;DENIES/REPORTS:21021675::"Denies"} nocturnal pain.  Difficulty dressing/grooming: {ACTIONS;DENIES/REPORTS:21021675::"Denies"} Difficulty climbing stairs: {ACTIONS;DENIES/REPORTS:21021675::"Denies"} Difficulty getting out of chair: {ACTIONS;DENIES/REPORTS:21021675::"Denies"} Difficulty using hands for taps, buttons, cutlery, and/or writing: {ACTIONS;DENIES/REPORTS:21021675::"Denies"}  No Rheumatology ROS completed.   PMFS History:  Patient Active Problem List   Diagnosis Date Noted  . Right hip pain 07/05/2016  . 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 Other psoriasis 03/29/2016  . 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 High risk medication use 03/29/2016  . Elevated LFTs 03/29/2016    Past Medical History:  Diagnosis Date  . Arthritis    Psoriatic arthritis   . Psoriasis     Family History  Problem Relation Age of Onset  . Arthritis Mother    No past surgical history on file. Social History   Social History Narrative  . Not on file    There is no immunization history on file for this patient.   Objective: Vital Signs: There were no vitals taken for this visit.   Physical Exam   Musculoskeletal Exam: ***  CDAI Exam: CDAI Score: Not documented Patient Global Assessment: Not documented; Provider Global Assessment: Not documented Swollen: Not documented; Tender: Not documented Joint Exam   Not documented   There is currently no information documented on the homunculus. Go to  the Rheumatology activity and complete the homunculus joint exam.  Investigation: No additional findings.  Imaging: No results found.  Recent Labs: Lab Results  Component Value Date   WBC 5.4 11/14/2017   HGB 14.7 11/14/2017   PLT 252 11/14/2017   NA 139 11/14/2017   K 4.2 11/14/2017   CL 106 11/14/2017   CO2 25 11/14/2017   GLUCOSE 89 11/14/2017   BUN 9 11/14/2017   CREATININE 0.82 11/14/2017   BILITOT 1.0 11/14/2017   ALKPHOS 43 10/26/2016   AST 23 11/14/2017   ALT 41 11/14/2017   PROT 7.5 11/14/2017   ALBUMIN 4.4 10/26/2016   CALCIUM 9.7 11/14/2017   GFRAA 132 11/14/2017    Speciality Comments: No specialty comments available.  Procedures:  No procedures performed Allergies: Patient has no known allergies.   Assessment / Plan:     Visit Diagnoses: No diagnosis found.   Orders: No orders of the defined types were placed in this encounter.  No orders of the defined types were placed in this encounter.   Face-to-face time spent with patient was *** minutes. Greater than 50% of time was spent in counseling and coordination of care.  Follow-Up Instructions: No follow-ups on file.   Ellen Henri, CMA  Note - This record has been created using Animal nutritionist.  Chart creation errors have been sought, but may not always  have been located. Such creation errors do not reflect on  the standard of medical care.

## 2018-06-27 ENCOUNTER — Ambulatory Visit: Payer: 59 | Admitting: Rheumatology

## 2018-08-06 ENCOUNTER — Ambulatory Visit: Payer: 59 | Admitting: Rheumatology

## 2019-11-22 NOTE — Progress Notes (Signed)
Office Visit Note  Patient: Colton Anthony             Date of Birth: 1981/04/09           MRN: [id number]             PCP: [facility] Referring: [facility] Visit Date: 12/04/2019 Occupation: @GUAROCC @  Subjective:  Medication management.   History of Present Illness: Colton Anthony is a 39 y.o. male with history of psoriatic arthritis and psoriasis.  He was seen by me last in July 2019.  He moved to [place] after that and has returned now.  He states he continued Humira over there.  He was also getting regular labs.  He denies having joints pain or joint swelling.  He has been tolerating medications well.  Patient states that he had some abnormality in the lab work and he was advised to reduce methotrexate from 6 tablets/week to 5 tablets/week about 3 months ago.  He was experiencing increased pain so he took methotrexate 6 tablets p.o. weekly for 1 dose last week.  Activities of Daily Living:  Patient reports morning stiffness for 0 minutes.   Patient Denies nocturnal pain.  Difficulty dressing/grooming: Denies Difficulty climbing stairs: Denies Difficulty getting out of chair: Denies Difficulty using hands for taps, buttons, cutlery, and/or writing: Denies  Review of Systems  Constitutional: Negative for fatigue.  HENT: Negative for mouth sores, mouth dryness and nose dryness.   Eyes: Negative for itching and dryness.  Respiratory: Negative for shortness of breath and difficulty breathing.   Cardiovascular: Negative for chest pain and palpitations.  Gastrointestinal: Negative for blood in stool, constipation and diarrhea.  Endocrine: Negative for increased urination.  Genitourinary: Negative for difficulty urinating.  Musculoskeletal: Negative for arthralgias, joint pain, joint swelling, myalgias, morning stiffness, muscle tenderness and myalgias.  Skin: Negative for color change, rash and redness.  Allergic/Immunologic: Negative for  susceptible to infections.  Neurological: Negative for dizziness, numbness, headaches, memory loss and weakness.  Hematological: Negative for bruising/bleeding tendency.  Psychiatric/Behavioral: Negative for confusion.    PMFS History:  Patient Active Problem List   Diagnosis Date Noted  . Right hip pain 07/05/2016  . 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 Other psoriasis 03/29/2016  . 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 High risk medication use 03/29/2016  . Elevated LFTs 03/29/2016    Past Medical History:  Diagnosis Date  . Arthritis    Psoriatic arthritis   . Psoriasis     Family History  Problem Relation Age of Onset  . Arthritis Mother    History reviewed. No pertinent surgical history. Social History   Social History Narrative  . Not on file   Immunization History  Administered Date(s) Administered  . Moderna SARS-COVID-2 Vaccination 08/06/2019, 10/03/2019     Objective: Vital Signs: BP 107/73 (BP Location: Left Arm, Patient Position: Sitting, Cuff Size: Normal)   Pulse (!) 57   Resp 15   Ht 5\' 9"  (1.753 m)   Wt 171 lb 9.6 oz (77.8 kg)   BMI 25.34 kg/m    Physical Exam Vitals and nursing note reviewed.  Constitutional:      Appearance: He is well-developed.  HENT:     Head: Normocephalic and atraumatic.  Eyes:     Conjunctiva/sclera: Conjunctivae normal.     Pupils: Pupils are equal, round, and reactive to light.  Cardiovascular:     Rate and Rhythm: Normal rate and regular rhythm.  Heart sounds: Normal heart sounds.  Pulmonary:     Effort: Pulmonary effort is normal.     Breath sounds: Normal breath sounds.  Abdominal:     General: Bowel sounds are normal.     Palpations: Abdomen is soft.  Musculoskeletal:     Cervical back: Normal range of motion and neck supple.  Skin:    General: Skin is warm and dry.     Capillary Refill: Capillary refill takes less than 2 seconds.  Neurological:     Mental Status: He is  alert and oriented to person, place, and time.  Psychiatric:        Behavior: Behavior normal.      Musculoskeletal Exam: C-spine, thoracic and lumbar spine with good range of motion.  Shoulder joints, elbow joints, wrist joints with good range of motion.  He had shortening of his right second and left fourth fingers due to psoriatic arthritis.  No synovitis was noted.  Hip joints, knee joints, ankles, MTPs and PIPs with good range of motion with no synovitis.  CDAI Exam: CDAI Score: 0  Patient Global: 0 mm; Provider Global: 0 mm Swollen: 0 ; Tender: 0  Joint Exam 12/04/2019   No joint exam has been documented for this visit   There is currently no information documented on the homunculus. Go to the Rheumatology activity and complete the homunculus joint exam.  Investigation: No additional findings.  Imaging: No results found.  Recent Labs: Lab Results  Component Value Date   WBC 5.4 11/14/2017   HGB 14.7 11/14/2017   PLT 252 11/14/2017   NA 139 11/14/2017   K 4.2 11/14/2017   CL 106 11/14/2017   CO2 25 11/14/2017   GLUCOSE 89 11/14/2017   BUN 9 11/14/2017   CREATININE 0.82 11/14/2017   BILITOT 1.0 11/14/2017   ALKPHOS 43 10/26/2016   AST 23 11/14/2017   ALT 41 11/14/2017   PROT 7.5 11/14/2017   ALBUMIN 4.4 10/26/2016   CALCIUM 9.7 11/14/2017   GFRAA 132 11/14/2017    Speciality Comments: No specialty comments available.  Procedures:  No procedures performed Allergies: Patient has no known allergies.   Assessment / Plan:     Visit Diagnoses: Psoriatic arthritis (HCC) - erosive, H/O dactylitis, TMJ arthritis.  He moved to [place] for 2 years and is back now.  He states he continued Humira over there.  He was also on methotrexate 6 tablets p.o. weekly.  His dose of methotrexate was reduced to 5 tablets p.o. weekly due to change in his blood count per patient.  He states he took methotrexate 6 tablets p.o. weekly last week.  We will get labs today.  I will renew  methotrexate and Humira consent.  Side effects were reviewed.  We will refill medications once the labs are available.  Drug Counseling  Hepatitis panel: July 29, 2014  Chest-xray: October 14, 2017  Contraception: Discussed  Alcohol use: None  Patient was counseled on the purpose, proper use, and adverse effects of methotrexate including nausea, infection, and signs and symptoms of pneumonitis.  Reviewed instructions with patient to take methotrexate weekly along with folic acid daily.  Discussed the importance of frequent monitoring of kidney and liver function and blood counts, and provided patient with standing lab instructions.  Counseled patient to avoid NSAIDs and alcohol while on methotrexate.  Provided patient with educational materials on methotrexate and answered all questions.  Advised patient to get annual influenza vaccine and to get a pneumococcal vaccine if patient  has not already had one.  Patient voiced understanding.  Patient consented to methotrexate use.  Will upload into chart.    Medication counseling:   TB Test: History of positive TB Gold.  He gets chest x-ray yearly. Hepatitis panel: July 29, 2014 HIV: 04/ 03/2015 SPEP: July 29, 2014 Immunoglobulins: July 29, 2014  Chest x-ray: 05/16/2017  Does patient have diagnosis of heart failure?  No  Counseled patient that Humira is a TNF blocking agent.  Reviewed Humira dose of 40 mg every other week.  Counseled patient on purpose, proper use, and adverse effects of Humira.  Reviewed the most common adverse effects including infections, headache, and injection site reactions. Discussed that there is the possibility of an increased risk of malignancy but it is not well understood if this increased risk is due to the medication or the disease state.  Advised patient to get yearly dermatology exams due to risk of skin cancer.  Reviewed the importance of regular labs while on Humira therapy.  Advised patient to get standing labs  one month after starting Humira then every 3 months.  Provided patient with standing lab orders.  Counseled patient that Humira should be held prior to scheduled surgery.  Counseled patient to avoid live vaccines while on Humira.  Advised patient to get annual influenza vaccine and the pneumococcal vaccine as needed.  Provided patient with medication education material and answered all questions.  Patient voiced understanding.  Patient consented to Humira.  Will upload consent into the media tab.  Reviewed storage instructions of Humira.  Advised initial injection must be administered in office.    Patient voiced understanding.     Psoriasis-he had no active lesions.  High risk medication use - Humira 40 mg sq q owk, MTX 6 tabs po q wk, folic acid 2 mg po qd - Plan: CBC with Differential/Platelet, COMPLETE METABOLIC PANEL WITH GFR, DG Chest 2 View today and then every 3 months to monitor for drug toxicity.  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 Yearly CXR required.  Orders: Orders Placed This Encounter  Procedures  . DG Chest 2 View  . CBC with Differential/Platelet  . COMPLETE METABOLIC PANEL WITH GFR   No orders of the defined types were placed in this encounter.    Follow-Up Instructions: Return in about 3 months (around 03/05/2020) for Psoriatic arthritis.   Pollyann Savoy, MD  Note - This record has been created using Animal nutritionist.  Chart creation errors have been sought, but may not always  have been located. Such creation errors do not reflect on  the standard of medical care.

## 2019-12-04 ENCOUNTER — Other Ambulatory Visit: Payer: Self-pay

## 2019-12-04 ENCOUNTER — Telehealth: Payer: Self-pay

## 2019-12-04 ENCOUNTER — Encounter: Payer: Self-pay | Admitting: Rheumatology

## 2019-12-04 ENCOUNTER — Ambulatory Visit (INDEPENDENT_AMBULATORY_CARE_PROVIDER_SITE_OTHER): Payer: 59 | Admitting: Rheumatology

## 2019-12-04 VITALS — BP 107/73 | HR 57 | Resp 15 | Ht 69.0 in | Wt 171.6 lb

## 2019-12-04 DIAGNOSIS — Z79899 Other long term (current) drug therapy: Secondary | ICD-10-CM

## 2019-12-04 DIAGNOSIS — L4050 Arthropathic psoriasis, unspecified: Secondary | ICD-10-CM

## 2019-12-04 DIAGNOSIS — R7612 Nonspecific reaction to cell mediated immunity measurement of gamma interferon antigen response without active tuberculosis: Secondary | ICD-10-CM | POA: Diagnosis not present

## 2019-12-04 DIAGNOSIS — L409 Psoriasis, unspecified: Secondary | ICD-10-CM | POA: Diagnosis not present

## 2019-12-04 NOTE — Telephone Encounter (Signed)
Patient is on Humira and recently moved back to the [place] after living in [place]. Please do a benefits investigation for humira coverage. Thanks!

## 2019-12-04 NOTE — Telephone Encounter (Signed)
Submitted a Prior Authorization request to [facility] for HUMIRA via Cover My Meds. Will update once we receive a response.   PA# [id number] Phone# [phone]

## 2019-12-04 NOTE — Patient Instructions (Addendum)
COVID-19 vaccine recommendations:   COVID-19 vaccine is recommended for everyone (unless you are allergic to a vaccine component), even if you are on a medication that suppresses your immune system.   If you are on Methotrexate, Cellcept (mycophenolate), Rinvoq, Harriette Ohara, and Olumiant- hold the medication for 1 week after each vaccine. Hold Methotrexate for 2 weeks after the single dose COVID-19 vaccine.   If you are on Orencia subcutaneous injection - hold medication one week prior to and one week after the first COVID-19 vaccine dose (only).   If you are on Orencia IV infusions- time vaccination administration so that the first COVID-19 vaccination will occur four weeks after the infusion and postpone the subsequent infusion by one week.   If you are on Cyclophosphamide or Rituxan infusions please contact your doctor prior to receiving the COVID-19 vaccine.   Do not take Tylenol or ant anti-inflammatory medications (NSAIDs) 24 hours prior to the COVID-19 vaccination.   There is no direct evidence about the efficacy of the COVID-19 vaccine in individuals who are on medications that suppress the immune system.   Even if you are fully vaccinated, and you are on any medications that suppress your immune system, please continue to wear a mask, maintain at least six feet social distance and practice hand hygiene.   If you develop a COVID-19 infection, please contact your PCP or our office to determine if you need antibody infusion.  The booster vaccine is now available for immunocompromised patients. It is advised that if you had Pfizer vaccine you should get [employer] booster.  If you had a Moderna vaccine then you should get a Moderna booster. Johnson and Laural Benes does not have a booster vaccine at this time.  Please see the following web sites for updated information.    https://[email or web address]/[email or web address]/0/Files/COVID-19-Vaccination-Patient-Resources.pdf  [email or web address]://[email or web address].org/About-Us/Newsroom/Press-Releases/ID/1159  Standing Labs We placed an order today for your standing lab work.   Please have your standing labs drawn in November and every 3 months  If possible, please have your labs drawn 2 weeks prior to your appointment so that the provider can discuss your results at your appointment.  We have open lab daily Monday through Thursday from 8:30-12:30 PM and 1:30-4:30 PM and Friday from 8:30-12:30 PM and 1:30-4:00 PM at the office of Dr. Pollyann Savoy, [facility].   Please be advised, patients with office appointments requiring lab work will take precedents over walk-in lab work.  If possible, please come for your lab work on Monday and Friday afternoons, as you may experience shorter wait times. The office is located at [address], [address], [place], [place] [address] No appointment is necessary.   Labs are drawn by [facility]. Please bring your co-pay at the time of your lab draw.  You may receive a bill from [facility] for your lab work.  If you wish to have your labs drawn at another location, please call the office 24 hours in advance to send orders.  If you have any questions regarding directions or hours of operation,  please call [phone].   As a reminder, please drink plenty of water prior to coming for your lab work. Thanks!

## 2019-12-05 LAB — COMPLETE METABOLIC PANEL WITH GFR
AG Ratio: 1.6 (calc) (ref 1.0–2.5)
ALT: 29 U/L (ref 9–46)
AST: 31 U/L (ref 10–40)
Albumin: 4.7 g/dL (ref 3.6–5.1)
Alkaline phosphatase (APISO): 43 U/L (ref 36–130)
BUN: 12 mg/dL (ref 7–25)
CO2: 22 mmol/L (ref 20–32)
Calcium: 10 mg/dL (ref 8.6–10.3)
Chloride: 105 mmol/L (ref 98–110)
Creat: 0.94 mg/dL (ref 0.60–1.35)
GFR, Est African American: 119 mL/min/{1.73_m2} (ref >=60)
GFR, Est Non African American: 102 mL/min/{1.73_m2} (ref >=60)
Globulin: 3 g/dL (calc) (ref 1.9–3.7)
Glucose, Bld: 74 mg/dL (ref 65–99)
Potassium: 4.5 mmol/L (ref 3.5–5.3)
Sodium: 139 mmol/L (ref 135–146)
Total Bilirubin: 1.2 mg/dL (ref 0.2–1.2)
Total Protein: 7.7 g/dL (ref 6.1–8.1)

## 2019-12-05 LAB — CBC WITH DIFFERENTIAL/PLATELET
Absolute Monocytes: 446 cells/uL (ref 200–950)
Basophils Absolute: 81 cells/uL (ref 0–200)
Basophils Relative: 1.3 %
Eosinophils Absolute: 99 cells/uL (ref 15–500)
Eosinophils Relative: 1.6 %
HCT: 43.7 % (ref 38.5–50.0)
Hemoglobin: 14.5 g/dL (ref 13.2–17.1)
Lymphs Abs: 2300 cells/uL (ref 850–3900)
MCH: 29.9 pg (ref 27.0–33.0)
MCHC: 33.2 g/dL (ref 32.0–36.0)
MCV: 90.1 fL (ref 80.0–100.0)
MPV: 11.8 fL (ref 7.5–12.5)
Monocytes Relative: 7.2 %
Neutro Abs: 3274 cells/uL (ref 1500–7800)
Neutrophils Relative %: 52.8 %
Platelets: 217 10*3/uL (ref 140–400)
RBC: 4.85 10*6/uL (ref 4.20–5.80)
RDW: 13.8 % (ref 11.0–15.0)
Total Lymphocyte: 37.1 %
WBC: 6.2 10*3/uL (ref 3.8–10.8)

## 2019-12-05 NOTE — Progress Notes (Signed)
He should be able to take methotrexate 6 tablets p.o. weekly as his labs are normal.  He should get labs every 3 months.

## 2019-12-05 NOTE — Progress Notes (Signed)
CBC and CMP are normal.

## 2019-12-10 NOTE — Telephone Encounter (Signed)
Received notification from [facility] regarding a prior authorization for HUMIRA. Authorization has been APPROVED from 12/09/19 to 12/08/2020.   PA# [id number]  Verlin Fester, PharmD, BCACP, CPP Clinical Specialty Pharmacist (Rheumatology and Pulmonology)  12/10/2019 8:22 AM

## 2019-12-20 ENCOUNTER — Telehealth: Payer: Self-pay | Admitting: Rheumatology

## 2019-12-20 NOTE — Telephone Encounter (Signed)
Patient called stating his new pharmacy through employer is [facility].  Patient is requesting prescription refill of Humira faxed to [phone] today so he can schedule delivery.  Patient states their phone #[phone]

## 2019-12-20 NOTE — Telephone Encounter (Signed)
Patient advised he will need to update his chest x-ray prior to refilling Humira. Patient verbalized understanding.

## 2019-12-24 ENCOUNTER — Other Ambulatory Visit: Payer: Self-pay

## 2019-12-24 ENCOUNTER — Ambulatory Visit (HOSPITAL_COMMUNITY)
Admission: RE | Admit: 2019-12-24 | Discharge: 2019-12-24 | Disposition: A | Discharge status: Home / Self Care | Payer: 59 | Source: Ambulatory Visit | Attending: Rheumatology | Admitting: Rheumatology

## 2019-12-24 DIAGNOSIS — Z79899 Other long term (current) drug therapy: Secondary | ICD-10-CM | POA: Insufficient documentation

## 2019-12-25 ENCOUNTER — Telehealth: Payer: Self-pay | Admitting: Rheumatology

## 2019-12-25 MED ORDER — CLOBETASOL PROPIONATE 0.05 % EX OINT: 1.0000 "application " | TOPICAL_OINTMENT | Freq: Two times a day (BID) | CUTANEOUS | 2 refills | Status: DC

## 2019-12-25 MED ORDER — DICLOFENAC SODIUM 1 % EX GEL
CUTANEOUS | 2 refills | Status: DC
Start: 2019-12-25 — End: 2020-08-12

## 2019-12-25 MED ORDER — HUMIRA PEN 40 MG/0.8ML ~~LOC~~ PNKT: 40.0000 mg | PEN_INJECTOR | SUBCUTANEOUS | 0 refills | Status: DC

## 2019-12-25 NOTE — Telephone Encounter (Signed)
Patient called stating he had his chest x-ray yesterday and is checking if Dr. Corliss Skains was able to review the results so he can take Humira.

## 2019-12-25 NOTE — Telephone Encounter (Signed)
Patient advised chest x-ray is unremarkable.   Patient also asked for refill on Clobetasol and Voltaren Gel.   Last Visit: 12/04/2019 Next Visit: 03/04/2020 Labs: 12/04/2019 CBC and CMP are normal. Chest x-ray results: 12/24/2019 WNL  Current Dose per office note 12/04/2019: Humira 40 mg sq q owk  KF:[id number] arthritis   Okay to refill per Dr. Corliss Skains

## 2019-12-25 NOTE — Progress Notes (Signed)
Chest x-ray is unremarkable.

## 2020-02-19 NOTE — Progress Notes (Deleted)
   Office Visit Note  Patient: Colton Anthony             Date of Birth: 1980/07/04           MRN: [id number]             PCP: [facility] Referring: [facility] Visit Date: 03/04/2020 Occupation: @GUAROCC @  Subjective:  No chief complaint on file.   History of Present Illness: Colton Anthony is a 39 y.o. male ***   Activities of Daily Living:  Patient reports morning stiffness for *** {minute/hour:19697}.   Patient {ACTIONS;DENIES/REPORTS:21021675::"Denies"} nocturnal pain.  Difficulty dressing/grooming: {ACTIONS;DENIES/REPORTS:21021675::"Denies"} Difficulty climbing stairs: {ACTIONS;DENIES/REPORTS:21021675::"Denies"} Difficulty getting out of chair: {ACTIONS;DENIES/REPORTS:21021675::"Denies"} Difficulty using hands for taps, buttons, cutlery, and/or writing: {ACTIONS;DENIES/REPORTS:21021675::"Denies"}  No Rheumatology ROS completed.   PMFS History:  Patient Active Problem List   Diagnosis Date Noted  . Right hip pain 07/05/2016  . 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 Other psoriasis 03/29/2016  . 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 High risk medication use 03/29/2016  . Elevated LFTs 03/29/2016    Past Medical History:  Diagnosis Date  . Arthritis    Psoriatic arthritis   . Psoriasis     Family History  Problem Relation Age of Onset  . Arthritis Mother    No past surgical history on file. Social History   Social History Narrative  . Not on file   Immunization History  Administered Date(s) Administered  . Moderna SARS-COVID-2 Vaccination 08/06/2019, 10/03/2019     Objective: Vital Signs: There were no vitals taken for this visit.   Physical Exam   Musculoskeletal Exam: ***  CDAI Exam: CDAI Score: -- Patient Global: --; Provider Global: -- Swollen: --; Tender: -- Joint Exam 03/04/2020   No joint exam has been documented for this visit   There is currently no information documented on  the homunculus. Go to the Rheumatology activity and complete the homunculus joint exam.  Investigation: No additional findings.  Imaging: No results found.  Recent Labs: Lab Results  Component Value Date   WBC 6.2 12/04/2019   HGB 14.5 12/04/2019   PLT 217 12/04/2019   NA 139 12/04/2019   K 4.5 12/04/2019   CL 105 12/04/2019   CO2 22 12/04/2019   GLUCOSE 74 12/04/2019   BUN 12 12/04/2019   CREATININE 0.94 12/04/2019   BILITOT 1.2 12/04/2019   ALKPHOS 43 10/26/2016   AST 31 12/04/2019   ALT 29 12/04/2019   PROT 7.7 12/04/2019   ALBUMIN 4.4 10/26/2016   CALCIUM 10.0 12/04/2019   GFRAA 119 12/04/2019    Speciality Comments: No specialty comments available.  Procedures:  No procedures performed Allergies: Patient has no known allergies.   Assessment / Plan:     Visit Diagnoses: No diagnosis found.  Orders: No orders of the defined types were placed in this encounter.  No orders of the defined types were placed in this encounter.   Face-to-face time spent with patient was *** minutes. Greater than 50% of time was spent in counseling and coordination of care.  Follow-Up Instructions: No follow-ups on file.   12/06/2019, CMA  Note - This record has been created using Ellen Henri.  Chart creation errors have been sought, but may not always  have been located. Such creation errors do not reflect on  the standard of medical care.

## 2020-03-02 ENCOUNTER — Other Ambulatory Visit: Payer: Self-pay | Admitting: Rheumatology

## 2020-03-02 NOTE — Telephone Encounter (Signed)
Last Visit: 12/04/2019 Next Visit: 05/05/2020 Labs: 12/04/2019 CBC and CMP are normal. Chest X-ray: 12/24/2019  No active cardiopulmonary disease.  Current Dose per office note 12/04/2019: Humira 40 mg sq q owk  OE:[id number] arthritis   Patient advised he is due to update labs. Patient states he will come this week to update labs.   Okay to refill 30 day supply Humira?

## 2020-03-04 ENCOUNTER — Ambulatory Visit: Payer: 59 | Admitting: Rheumatology

## 2020-03-04 DIAGNOSIS — L4050 Arthropathic psoriasis, unspecified: Secondary | ICD-10-CM

## 2020-03-04 DIAGNOSIS — Z79899 Other long term (current) drug therapy: Secondary | ICD-10-CM

## 2020-03-04 DIAGNOSIS — L409 Psoriasis, unspecified: Secondary | ICD-10-CM

## 2020-03-04 DIAGNOSIS — R7612 Nonspecific reaction to cell mediated immunity measurement of gamma interferon antigen response without active tuberculosis: Secondary | ICD-10-CM

## 2020-03-20 ENCOUNTER — Other Ambulatory Visit: Payer: Self-pay | Admitting: *Deleted

## 2020-03-20 DIAGNOSIS — Z79899 Other long term (current) drug therapy: Secondary | ICD-10-CM

## 2020-03-21 LAB — CBC WITH DIFFERENTIAL/PLATELET
Absolute Monocytes: 504 cells/uL (ref 200–950)
Basophils Absolute: 73 cells/uL (ref 0–200)
Basophils Relative: 1.3 %
Eosinophils Absolute: 90 cells/uL (ref 15–500)
Eosinophils Relative: 1.6 %
HCT: 42.6 % (ref 38.5–50.0)
Hemoglobin: 14.2 g/dL (ref 13.2–17.1)
Lymphs Abs: 2223 cells/uL (ref 850–3900)
MCH: 29.5 pg (ref 27.0–33.0)
MCHC: 33.3 g/dL (ref 32.0–36.0)
MCV: 88.4 fL (ref 80.0–100.0)
MPV: 12 fL (ref 7.5–12.5)
Monocytes Relative: 9 %
Neutro Abs: 2710 cells/uL (ref 1500–7800)
Neutrophils Relative %: 48.4 %
Platelets: 244 10*3/uL (ref 140–400)
RBC: 4.82 10*6/uL (ref 4.20–5.80)
RDW: 14.1 % (ref 11.0–15.0)
Total Lymphocyte: 39.7 %
WBC: 5.6 10*3/uL (ref 3.8–10.8)

## 2020-03-21 LAB — COMPLETE METABOLIC PANEL WITH GFR
AG Ratio: 1.4 (calc) (ref 1.0–2.5)
ALT: 30 U/L (ref 9–46)
AST: 21 U/L (ref 10–40)
Albumin: 4.4 g/dL (ref 3.6–5.1)
Alkaline phosphatase (APISO): 41 U/L (ref 36–130)
BUN: 11 mg/dL (ref 7–25)
CO2: 28 mmol/L (ref 20–32)
Calcium: 9.8 mg/dL (ref 8.6–10.3)
Chloride: 105 mmol/L (ref 98–110)
Creat: 0.79 mg/dL (ref 0.60–1.35)
GFR, Est African American: 131 mL/min/{1.73_m2} (ref >=60)
GFR, Est Non African American: 113 mL/min/{1.73_m2} (ref >=60)
Globulin: 3.1 g/dL (calc) (ref 1.9–3.7)
Glucose, Bld: 86 mg/dL (ref 65–99)
Potassium: 4.5 mmol/L (ref 3.5–5.3)
Sodium: 140 mmol/L (ref 135–146)
Total Bilirubin: 1.3 mg/dL — ABNORMAL HIGH (ref 0.2–1.2)
Total Protein: 7.5 g/dL (ref 6.1–8.1)

## 2020-03-21 NOTE — Progress Notes (Signed)
CBC and CMP normal

## 2020-04-16 ENCOUNTER — Other Ambulatory Visit: Payer: Self-pay | Admitting: Physician Assistant

## 2020-04-16 NOTE — Telephone Encounter (Signed)
Last Visit: 12/04/2019 Next Visit: 05/05/2020 Labs: 03/20/2020 CBC and CMP normal. Chest X-ray: 12/24/2019  No active cardiopulmonary disease.  Current Dose per office note 12/04/2019: Humira 40 mg sq q owk  DJ:[id number] arthritis   Okay to refill per Dr. Corliss Skains

## 2020-04-21 NOTE — Progress Notes (Signed)
Virtual Visit via Telephone Note  I connected with Colton Anthony on 05/05/20 at  8:00 AM EST by telephone and verified that I am speaking with the correct person using two identifiers.  Location: Patient: Home  Provider: Clinic  This service was conducted via virtual visit.  The patient was located at home. I was located in my office.  Consent was obtained prior to the virtual visit and is aware of possible charges through their insurance for this visit.  The patient is an established patient.  Dr. Corliss Skains, MD conducted the virtual visit and Sherron Ales, PA-C acted as scribe during the service.  Office staff helped with scheduling follow up visits after the service was conducted.   I discussed the limitations, risks, security and privacy concerns of performing an evaluation and management service by telephone and the availability of in person appointments. I also discussed with the patient that there may be a patient responsible charge related to this service. The patient expressed understanding and agreed to proceed.  CC: Medication monitoring  History of Present Illness: Patient is a 40 year old male with a past medical history of psoriatic arthritis.  He is on humira 40 mg sq injections every 14 days, Methotrexate 6 tablets by mouth once weekly, and folic acid 2 mg po daily.  He has not missed any doses recently.  He denies any side effects.  He denies any psoriatic arthritis flares.  He denies any joint pain, joint swelling, or morning stiffness.  He denies any achilles tendonitis or plantar fasciitis. He denies any psoriasis at this time.  He denies any recent infections. He received the moderna booster vaccine dose on 05/01/20.   Review of Systems  Constitutional: Negative for malaise/fatigue.  HENT: Negative for congestion.   Eyes: Negative for pain.  Respiratory: Negative for shortness of breath.   Cardiovascular: Negative for leg swelling.  Gastrointestinal: Negative for constipation.   Genitourinary: Negative for urgency.  Musculoskeletal: Negative for joint pain.  Skin: Negative for rash.  Neurological: Negative for weakness.  Endo/Heme/Allergies: Does not bruise/bleed easily.  Psychiatric/Behavioral: The patient does not have insomnia.       Observations/Objective: Physical Exam Neurological:     Mental Status: He is alert and oriented to person, place, and time.  Psychiatric:        Mood and Affect: Mood and affect normal.        Cognition and Memory: Memory normal.        Judgment: Judgment normal.    Patient reports morning stiffness for 0 none.   Patient denies nocturnal pain.  Difficulty dressing/grooming: Denies Difficulty climbing stairs: Denies Difficulty getting out of chair: Denies Difficulty using hands for taps, buttons, cutlery, and/or writing: Denies   Assessment and Plan: Visit Diagnoses: Psoriatic arthritis (HCC) - erosive, H/O dactylitis, TMJ arthritis: He has not had any recent psoriatic arthritis flares.  He is clinically doing well on humira 40 mg sq injections every 14 days, Methotrexate 6 tablets by mouth once weekly, and folic acid 2 mg po daily.  He has not missed any doses recently.  He is not experiencing any joint pain, joint swelling, morning stiffness, or nocturnal pain at this time.  He has not had any difficulty with ADLs.  He has no Achilles tendinitis or plantar fasciitis.  He has not had any SI joint pain.  He has no active psoriasis.  He will continue on the current treatment regimen.  A refill of methotrexate will be sent to the pharmacy.  He was advised to notify us if he develops increased joint pain or joint swelling.  He will follow-up in the office in 3 to 4 months.  Psoriasis-He has no active psoriasis at this time.   High risk medication use - Humira 40 mg sq injection every 14 days, MTX 6 tabs po q wk, and folic acid 2 mg po qd.  CBC and CMP updated on 03/20/20.  He will be due to update lab work in March and every 3  months to monitor for drug toxicity.  CXR updated on 12/24/19.  He will require yearly CXRs. - Plan: CBC with Differential/Platelet, COMPLETE METABOLIC PANEL WITH GFR, DG Chest 2 View today and then every 3 months to monitor for drug toxicity. He has not had any recent infections.  He was advised to hold humira and MTX if he develops signs or symptoms of an infection and to resume once the infection has completely cleared.  He received the moderna booster dose on 05/01/20.  He was advised to notify us or his PCP if he develops the covid-19 infection in order to receive the monoclonal antibody infusion.  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 Yearly CXR required.  CXR updated on 12/24/19.    Follow Up Instructions: He will follow up in 3-4 months.    I discussed the assessment and treatment plan with the patient. The patient was provided an opportunity to ask questions and all were answered. The patient agreed with the plan and demonstrated an understanding of the instructions.   The patient was advised to call back or seek an in-person evaluation if the symptoms worsen or if the condition fails to improve as anticipated.  I provided 20 minutes of non-face-to-face time during this encounter.  Scribed bySherron Ales, PA-C  I reviewed the above note and attest to the accuracy of the document.   Pollyann Savoy, MD

## 2020-05-05 ENCOUNTER — Encounter: Payer: Self-pay | Admitting: Rheumatology

## 2020-05-05 ENCOUNTER — Other Ambulatory Visit: Payer: Self-pay

## 2020-05-05 ENCOUNTER — Telehealth (INDEPENDENT_AMBULATORY_CARE_PROVIDER_SITE_OTHER): Payer: 59 | Admitting: Rheumatology

## 2020-05-05 DIAGNOSIS — Z79899 Other long term (current) drug therapy: Secondary | ICD-10-CM | POA: Diagnosis not present

## 2020-05-05 DIAGNOSIS — L4050 Arthropathic psoriasis, unspecified: Secondary | ICD-10-CM | POA: Diagnosis not present

## 2020-05-05 DIAGNOSIS — L409 Psoriasis, unspecified: Secondary | ICD-10-CM

## 2020-05-05 DIAGNOSIS — R7612 Nonspecific reaction to cell mediated immunity measurement of gamma interferon antigen response without active tuberculosis: Secondary | ICD-10-CM

## 2020-05-05 MED ORDER — METHOTREXATE 2.5 MG PO TABS: ORAL_TABLET | ORAL | 0 refills | Status: DC

## 2020-08-03 NOTE — Progress Notes (Deleted)
   Office Visit Note  Patient: Colton Anthony             Date of Birth: February 20, 1981           MRN: [id number]             PCP: [facility] Referring: [facility] Visit Date: 08/14/2020 Occupation: @GUAROCC @  Subjective:  No chief complaint on file.   History of Present Illness: Colton Anthony is a 40 y.o. male ***   Activities of Daily Living:  Patient reports morning stiffness for *** {minute/hour:19697}.   Patient {ACTIONS;DENIES/REPORTS:21021675::"Denies"} nocturnal pain.  Difficulty dressing/grooming: {ACTIONS;DENIES/REPORTS:21021675::"Denies"} Difficulty climbing stairs: {ACTIONS;DENIES/REPORTS:21021675::"Denies"} Difficulty getting out of chair: {ACTIONS;DENIES/REPORTS:21021675::"Denies"} Difficulty using hands for taps, buttons, cutlery, and/or writing: {ACTIONS;DENIES/REPORTS:21021675::"Denies"}  No Rheumatology ROS completed.   PMFS History:  Patient Active Problem List   Diagnosis Date Noted  . Right hip pain 07/05/2016  . 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 Other psoriasis 03/29/2016  . 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 High risk medication use 03/29/2016  . Elevated LFTs 03/29/2016    Past Medical History:  Diagnosis Date  . Arthritis    Psoriatic arthritis   . Psoriasis     Family History  Problem Relation Age of Onset  . Arthritis Mother    No past surgical history on file. Social History   Social History Narrative  . Not on file   Immunization History  Administered Date(s) Administered  . Moderna Sars-Covid-2 Vaccination 08/06/2019, 10/03/2019, 05/01/2020     Objective: Vital Signs: There were no vitals taken for this visit.   Physical Exam   Musculoskeletal Exam: ***  CDAI Exam: CDAI Score: -- Patient Global: --; Provider Global: -- Swollen: --; Tender: -- Joint Exam 08/14/2020   No joint exam has been documented for this visit   There is currently no information  documented on the homunculus. Go to the Rheumatology activity and complete the homunculus joint exam.  Investigation: No additional findings.  Imaging: No results found.  Recent Labs: Lab Results  Component Value Date   WBC 5.6 03/20/2020   HGB 14.2 03/20/2020   PLT 244 03/20/2020   NA 140 03/20/2020   K 4.5 03/20/2020   CL 105 03/20/2020   CO2 28 03/20/2020   GLUCOSE 86 03/20/2020   BUN 11 03/20/2020   CREATININE 0.79 03/20/2020   BILITOT 1.3 (H) 03/20/2020   ALKPHOS 43 10/26/2016   AST 21 03/20/2020   ALT 30 03/20/2020   PROT 7.5 03/20/2020   ALBUMIN 4.4 10/26/2016   CALCIUM 9.8 03/20/2020   GFRAA 131 03/20/2020    Speciality Comments: No specialty comments available.  Procedures:  No procedures performed Allergies: Patient has no known allergies.   Assessment / Plan:     Visit Diagnoses: No diagnosis found.  Orders: No orders of the defined types were placed in this encounter.  No orders of the defined types were placed in this encounter.   Face-to-face time spent with patient was *** minutes. Greater than 50% of time was spent in counseling and coordination of care.  Follow-Up Instructions: No follow-ups on file.   14/06/2019, CMA  Note - This record has been created using Ellen Henri.  Chart creation errors have been sought, but may not always  have been located. Such creation errors do not reflect on  the standard of medical care.

## 2020-08-04 ENCOUNTER — Other Ambulatory Visit: Payer: Self-pay | Admitting: *Deleted

## 2020-08-04 DIAGNOSIS — Z79899 Other long term (current) drug therapy: Secondary | ICD-10-CM

## 2020-08-05 LAB — CBC WITH DIFFERENTIAL/PLATELET
Absolute Monocytes: 583 cells/uL (ref 200–950)
Basophils Absolute: 72 cells/uL (ref 0–200)
Basophils Relative: 1.3 %
Eosinophils Absolute: 72 cells/uL (ref 15–500)
Eosinophils Relative: 1.3 %
HCT: 41.1 % (ref 38.5–50.0)
Hemoglobin: 13.6 g/dL (ref 13.2–17.1)
Lymphs Abs: 1997 cells/uL (ref 850–3900)
MCH: 29.8 pg (ref 27.0–33.0)
MCHC: 33.1 g/dL (ref 32.0–36.0)
MCV: 90.1 fL (ref 80.0–100.0)
MPV: 12.5 fL (ref 7.5–12.5)
Monocytes Relative: 10.6 %
Neutro Abs: 2778 cells/uL (ref 1500–7800)
Neutrophils Relative %: 50.5 %
Platelets: 207 10*3/uL (ref 140–400)
RBC: 4.56 10*6/uL (ref 4.20–5.80)
RDW: 13.6 % (ref 11.0–15.0)
Total Lymphocyte: 36.3 %
WBC: 5.5 10*3/uL (ref 3.8–10.8)

## 2020-08-05 LAB — COMPLETE METABOLIC PANEL WITH GFR
AG Ratio: 1.6 (calc) (ref 1.0–2.5)
ALT: 37 U/L (ref 9–46)
AST: 25 U/L (ref 10–40)
Albumin: 4.4 g/dL (ref 3.6–5.1)
Alkaline phosphatase (APISO): 44 U/L (ref 36–130)
BUN: 13 mg/dL (ref 7–25)
CO2: 26 mmol/L (ref 20–32)
Calcium: 9.5 mg/dL (ref 8.6–10.3)
Chloride: 106 mmol/L (ref 98–110)
Creat: 0.83 mg/dL (ref 0.60–1.35)
GFR, Est African American: 128 mL/min/{1.73_m2} (ref >=60)
GFR, Est Non African American: 111 mL/min/{1.73_m2} (ref >=60)
Globulin: 2.8 g/dL (calc) (ref 1.9–3.7)
Glucose, Bld: 90 mg/dL (ref 65–99)
Potassium: 5.3 mmol/L (ref 3.5–5.3)
Sodium: 140 mmol/L (ref 135–146)
Total Bilirubin: 1.1 mg/dL (ref 0.2–1.2)
Total Protein: 7.2 g/dL (ref 6.1–8.1)

## 2020-08-07 NOTE — Progress Notes (Deleted)
   Office Visit Note  Patient: Colton Anthony             Date of Birth: 1981/03/07           MRN: [id number]             PCP: [facility] Referring: [facility] Visit Date: 08/11/2020 Occupation: @GUAROCC @  Subjective:  No chief complaint on file.   History of Present Illness: Colton Anthony is a 40 y.o. male ***   Activities of Daily Living:  Patient reports morning stiffness for *** {minute/hour:19697}.   Patient {ACTIONS;DENIES/REPORTS:21021675::"Denies"} nocturnal pain.  Difficulty dressing/grooming: {ACTIONS;DENIES/REPORTS:21021675::"Denies"} Difficulty climbing stairs: {ACTIONS;DENIES/REPORTS:21021675::"Denies"} Difficulty getting out of chair: {ACTIONS;DENIES/REPORTS:21021675::"Denies"} Difficulty using hands for taps, buttons, cutlery, and/or writing: {ACTIONS;DENIES/REPORTS:21021675::"Denies"}  No Rheumatology ROS completed.   PMFS History:  Patient Active Problem List   Diagnosis Date Noted  . Right hip pain 07/05/2016  . 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 Other psoriasis 03/29/2016  . 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 High risk medication use 03/29/2016  . Elevated LFTs 03/29/2016    Past Medical History:  Diagnosis Date  . Arthritis    Psoriatic arthritis   . Psoriasis     Family History  Problem Relation Age of Onset  . Arthritis Mother    No past surgical history on file. Social History   Social History Narrative  . Not on file   Immunization History  Administered Date(s) Administered  . Moderna Sars-Covid-2 Vaccination 08/06/2019, 10/03/2019, 05/01/2020     Objective: Vital Signs: There were no vitals taken for this visit.   Physical Exam   Musculoskeletal Exam: ***  CDAI Exam: CDAI Score: -- Patient Global: --; Provider Global: -- Swollen: --; Tender: -- Joint Exam 08/11/2020   No joint exam has been documented for this visit   There is currently no information  documented on the homunculus. Go to the Rheumatology activity and complete the homunculus joint exam.  Investigation: No additional findings.  Imaging: No results found.  Recent Labs: Lab Results  Component Value Date   WBC 5.5 08/04/2020   HGB 13.6 08/04/2020   PLT 207 08/04/2020   NA 140 08/04/2020   K 5.3 08/04/2020   CL 106 08/04/2020   CO2 26 08/04/2020   GLUCOSE 90 08/04/2020   BUN 13 08/04/2020   CREATININE 0.83 08/04/2020   BILITOT 1.1 08/04/2020   ALKPHOS 43 10/26/2016   AST 25 08/04/2020   ALT 37 08/04/2020   PROT 7.2 08/04/2020   ALBUMIN 4.4 10/26/2016   CALCIUM 9.5 08/04/2020   GFRAA 128 08/04/2020    Speciality Comments: No specialty comments available.  Procedures:  No procedures performed Allergies: Patient has no known allergies.   Assessment / Plan:     Visit Diagnoses: No diagnosis found.  Orders: No orders of the defined types were placed in this encounter.  No orders of the defined types were placed in this encounter.   Face-to-face time spent with patient was *** minutes. Greater than 50% of time was spent in counseling and coordination of care.  Follow-Up Instructions: No follow-ups on file.   08/06/2020, CMA  Note - This record has been created using Ellen Henri.  Chart creation errors have been sought, but may not always  have been located. Such creation errors do not reflect on  the standard of medical care.

## 2020-08-11 ENCOUNTER — Ambulatory Visit: Payer: 59 | Admitting: Rheumatology

## 2020-08-11 DIAGNOSIS — L4050 Arthropathic psoriasis, unspecified: Secondary | ICD-10-CM

## 2020-08-11 DIAGNOSIS — R7612 Nonspecific reaction to cell mediated immunity measurement of gamma interferon antigen response without active tuberculosis: Secondary | ICD-10-CM

## 2020-08-11 DIAGNOSIS — L409 Psoriasis, unspecified: Secondary | ICD-10-CM

## 2020-08-11 DIAGNOSIS — Z79899 Other long term (current) drug therapy: Secondary | ICD-10-CM

## 2020-08-11 NOTE — Progress Notes (Signed)
Office Visit Note  Patient: Colton Anthony             Date of Birth: December 06, 1980           MRN: [id number]             PCP: [facility] Referring: [facility] Visit Date: 08/12/2020 Occupation: @GUAROCC @  Subjective:  Medication monitoring.   History of Present Illness: Orlin Kann is a 40 y.o. male with a history of psoriatic arthritis and psoriasis.  He states he has been taking methotrexate and Humira on a regular basis.  He is doing quite well without any episodes of psoriasis or arthritis.  He states few months ago he had some discomfort in his right knee joint for a day or so and then it resolved by itself.  He denies any history of plantar fasciitis or Achilles tendinitis.  Activities of Daily Living:  Patient reports morning stiffness for 0 minutes.   Patient Denies nocturnal pain.  Difficulty dressing/grooming: Denies Difficulty climbing stairs: Denies Difficulty getting out of chair: Denies Difficulty using hands for taps, buttons, cutlery, and/or writing: Denies  Review of Systems  Constitutional: Negative for fatigue.  HENT: Negative for mouth sores, mouth dryness and nose dryness.   Eyes: Negative for pain, itching and dryness.  Respiratory: Negative for shortness of breath and difficulty breathing.   Cardiovascular: Negative for chest pain and palpitations.  Gastrointestinal: Negative for blood in stool, constipation and diarrhea.  Endocrine: Negative for increased urination.  Genitourinary: Negative for difficulty urinating.  Musculoskeletal: Negative for arthralgias, joint pain, joint swelling, myalgias, morning stiffness, muscle tenderness and myalgias.  Skin: Negative for color change, rash and redness.  Allergic/Immunologic: Negative for susceptible to infections.  Neurological: Negative for dizziness, numbness, headaches, memory loss and weakness.  Hematological: Negative for bruising/bleeding tendency.   Psychiatric/Behavioral: Negative for confusion.    PMFS History:  Patient Active Problem List   Diagnosis Date Noted  . Right hip pain 07/05/2016  . 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 Other psoriasis 03/29/2016  . 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 High risk medication use 03/29/2016  . Elevated LFTs 03/29/2016    Past Medical History:  Diagnosis Date  . Arthritis    Psoriatic arthritis   . Psoriasis     Family History  Problem Relation Age of Onset  . Arthritis Mother    History reviewed. No pertinent surgical history. Social History   Social History Narrative  . Not on file   Immunization History  Administered Date(s) Administered  . Moderna Sars-Covid-2 Vaccination 08/06/2019, 10/03/2019, 05/01/2020     Objective: Vital Signs: BP 111/79 (BP Location: Left Arm, Patient Position: Sitting, Cuff Size: Normal)   Pulse 67   Resp 13   Ht 5\' 9"  (1.753 m)   Wt 178 lb (80.7 kg)   BMI 26.29 kg/m    Physical Exam Vitals and nursing note reviewed.  Constitutional:      Appearance: He is well-developed.  HENT:     Head: Normocephalic and atraumatic.  Eyes:     Conjunctiva/sclera: Conjunctivae normal.     Pupils: Pupils are equal, round, and reactive to light.  Cardiovascular:     Rate and Rhythm: Normal rate and regular rhythm.     Heart sounds: Normal heart sounds.  Pulmonary:     Effort: Pulmonary effort is normal.     Breath sounds: Normal breath sounds.  Abdominal:     General: Bowel sounds are  normal.     Palpations: Abdomen is soft.  Musculoskeletal:     Cervical back: Normal range of motion and neck supple.  Skin:    General: Skin is warm and dry.     Capillary Refill: Capillary refill takes less than 2 seconds.  Neurological:     Mental Status: He is alert and oriented to person, place, and time.  Psychiatric:        Behavior: Behavior normal.      Musculoskeletal Exam: C-spine thoracic and lumbar spine with  good range of motion.  He had no SI joint tenderness.  Shoulder joints, elbow joints, wrist joints, MCPs PIPs and DIPs with good range of motion.  He has deformity in his right index finger and left fifth digit due to psoriatic arthritis.  No synovitis was noted.  Hip joints, knee joints, ankles, MTPs with good range of motion with no synovitis.  CDAI Exam: CDAI Score: -- Patient Global: --; Provider Global: -- Swollen: --; Tender: -- Joint Exam 08/12/2020   No joint exam has been documented for this visit   There is currently no information documented on the homunculus. Go to the Rheumatology activity and complete the homunculus joint exam.  Investigation: No additional findings.  Imaging: No results found.  Recent Labs: Lab Results  Component Value Date   WBC 5.5 08/04/2020   HGB 13.6 08/04/2020   PLT 207 08/04/2020   NA 140 08/04/2020   K 5.3 08/04/2020   CL 106 08/04/2020   CO2 26 08/04/2020   GLUCOSE 90 08/04/2020   BUN 13 08/04/2020   CREATININE 0.83 08/04/2020   BILITOT 1.1 08/04/2020   ALKPHOS 43 10/26/2016   AST 25 08/04/2020   ALT 37 08/04/2020   PROT 7.2 08/04/2020   ALBUMIN 4.4 10/26/2016   CALCIUM 9.5 08/04/2020   GFRAA 128 08/04/2020    Speciality Comments: No specialty comments available.  Procedures:  No procedures performed Allergies: Patient has no known allergies.   Assessment / Plan:     Visit Diagnoses: Psoriatic arthritis (HCC) - erosive, H/O dactylitis, TMJ arthritis: He is clinically doing well on combination therapy with Humira and methotrexate.  He had no synovitis on examination.  Psoriasis-he has no active psoriasis lesions.  High risk medication use - Humira 40 mg sq injection every 14 days, MTX 6 tabs po q wk, and folic acid 2 mg po qd. his labs are stable.  He will get labs every 3 months.  Prescription refill for methotrexate was given today.  Side effects of methotrexate and Humira were again reviewed.  He was advised to stop the  medications in case he develops an infection and then resume once infection resolves.  He is also aware of for strict contraception while he is on methotrexate.  He is fully vaccinated against COVID-19.  Fourth COVID-19 vaccine dose (booster) was advised per ACR recommendations.  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 Yearly CXR required.  CXR updated on 12/24/19.    He will be moving to [place].  Have advised him to find a rheumatologist in [place] so we can transfer his records.  Orders: No orders of the defined types were placed in this encounter.  Meds ordered this encounter  Medications  . methotrexate (RHEUMATREX) 2.5 MG tablet    Sig: Take 6 tablets by mouth once weekly. Caution:Chemotherapy. Protect from light.    Dispense:  72 tablet    Refill:  0     Follow-Up Instructions: Return in about 3 months (  around 11/11/2020) for Psoriatic arthritis.   Pollyann Savoy, MD  Note - This record has been created using Animal nutritionist.  Chart creation errors have been sought, but may not always  have been located. Such creation errors do not reflect on  the standard of medical care.

## 2020-08-12 ENCOUNTER — Other Ambulatory Visit: Payer: Self-pay

## 2020-08-12 ENCOUNTER — Encounter: Payer: Self-pay | Admitting: Rheumatology

## 2020-08-12 ENCOUNTER — Ambulatory Visit: Payer: 59 | Admitting: Rheumatology

## 2020-08-12 VITALS — BP 111/79 | HR 67 | Resp 13 | Ht 69.0 in | Wt 178.0 lb

## 2020-08-12 DIAGNOSIS — Z79899 Other long term (current) drug therapy: Secondary | ICD-10-CM

## 2020-08-12 DIAGNOSIS — R7612 Nonspecific reaction to cell mediated immunity measurement of gamma interferon antigen response without active tuberculosis: Secondary | ICD-10-CM

## 2020-08-12 DIAGNOSIS — L409 Psoriasis, unspecified: Secondary | ICD-10-CM | POA: Diagnosis not present

## 2020-08-12 DIAGNOSIS — L4050 Arthropathic psoriasis, unspecified: Secondary | ICD-10-CM

## 2020-08-12 MED ORDER — METHOTREXATE 2.5 MG PO TABS: ORAL_TABLET | ORAL | 0 refills | Status: DC

## 2020-08-12 NOTE — Patient Instructions (Signed)
Standing Labs We placed an order today for your standing lab work.   Please have your standing labs drawn in July and every 3 months   If possible, please have your labs drawn 2 weeks prior to your appointment so that the provider can discuss your results at your appointment.  We have open lab daily Monday through Thursday from 1:30-4:30 PM and Friday from 1:30-4:00 PM at the office of Dr. Elishua Radford, [facility].   Please be advised, all patients with office appointments requiring lab work will take precedents over walk-in lab work.  If possible, please come for your lab work on Monday and Friday afternoons, as you may experience shorter wait times. The office is located at [address], [address], [place], [place] [address] No appointment is necessary.   Labs are drawn by [facility]. Please bring your co-pay at the time of your lab draw.  You may receive a bill from [facility] for your lab work.  If you wish to have your labs drawn at another location, please call the office 24 hours in advance to send orders.  If you have any questions regarding directions or hours of operation,  please call [phone].   As a reminder, please drink plenty of water prior to coming for your lab work. Thanks!     

## 2020-08-14 ENCOUNTER — Ambulatory Visit: Payer: 59 | Admitting: Rheumatology

## 2020-08-14 DIAGNOSIS — R7612 Nonspecific reaction to cell mediated immunity measurement of gamma interferon antigen response without active tuberculosis: Secondary | ICD-10-CM

## 2020-08-14 DIAGNOSIS — Z79899 Other long term (current) drug therapy: Secondary | ICD-10-CM

## 2020-08-14 DIAGNOSIS — L4050 Arthropathic psoriasis, unspecified: Secondary | ICD-10-CM

## 2020-08-14 DIAGNOSIS — L409 Psoriasis, unspecified: Secondary | ICD-10-CM

## 2020-10-09 ENCOUNTER — Telehealth: Payer: Self-pay

## 2020-10-09 NOTE — Telephone Encounter (Signed)
Submitted a Faxed Prior Authorization request to [facility] for HUMIRA. Will update once we receive a response.  Phone# [phone] Fax# [phone]

## 2020-10-12 NOTE — Telephone Encounter (Signed)
Received notification from [facility] regarding a prior authorization for HUMIRA. Authorization has been APPROVED from 10/09/20 to 10/09/21.   Patient can continue filling through [facility]  Chesley Mires, PharmD, MPH Clinical Pharmacist (Rheumatology and Pulmonology)

## 2020-10-20 NOTE — Progress Notes (Deleted)
   Office Visit Note  Patient: Colton Anthony             Date of Birth: 1980/06/27           MRN: [id number]             PCP: [facility] Referring: [facility] Visit Date: 11/03/2020 Occupation: @GUAROCC @  Subjective:  No chief complaint on file.   History of Present Illness: Colton Anthony is a 40 y.o. male ***   Activities of Daily Living:  Patient reports morning stiffness for *** {minute/hour:19697}.   Patient {ACTIONS;DENIES/REPORTS:21021675::"Denies"} nocturnal pain.  Difficulty dressing/grooming: {ACTIONS;DENIES/REPORTS:21021675::"Denies"} Difficulty climbing stairs: {ACTIONS;DENIES/REPORTS:21021675::"Denies"} Difficulty getting out of chair: {ACTIONS;DENIES/REPORTS:21021675::"Denies"} Difficulty using hands for taps, buttons, cutlery, and/or writing: {ACTIONS;DENIES/REPORTS:21021675::"Denies"}  No Rheumatology ROS completed.   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 Social History   Social History Narrative   Not on file   Immunization History  Administered Date(s) Administered   Moderna Sars-Covid-2 Vaccination 08/06/2019, 10/03/2019, 05/01/2020     Objective: Vital Signs: There were no vitals taken for this visit.   Physical Exam   Musculoskeletal Exam: ***  CDAI Exam: CDAI Score: -- Patient Global: --; Provider Global: -- Swollen: --; Tender: -- Joint Exam 11/03/2020   No joint exam has been documented for this visit   There is currently no information documented  on the homunculus. Go to the Rheumatology activity and complete the homunculus joint exam.  Investigation: No additional findings.  Imaging: No results found.  Recent Labs: Lab Results  Component Value Date   WBC 5.5 08/04/2020   HGB 13.6 08/04/2020   PLT 207 08/04/2020   NA 140 08/04/2020   K 5.3 08/04/2020   CL 106 08/04/2020   CO2 26 08/04/2020   GLUCOSE 90 08/04/2020   BUN 13 08/04/2020   CREATININE 0.83 08/04/2020   BILITOT 1.1 08/04/2020   ALKPHOS 43 10/26/2016   AST 25 08/04/2020   ALT 37 08/04/2020   PROT 7.2 08/04/2020   ALBUMIN 4.4 10/26/2016   CALCIUM 9.5 08/04/2020   GFRAA 128 08/04/2020    Speciality Comments: No specialty comments available.  Procedures:  No procedures performed Allergies: Patient has no known allergies.   Assessment / Plan:     Visit Diagnoses: No diagnosis found.  Orders: No orders of the defined types were placed in this encounter.  No orders of the defined types were placed in this encounter.   Face-to-face time spent with patient was *** minutes. Greater than 50% of time was spent in counseling and coordination of care.  Follow-Up Instructions: No follow-ups on file.   08/06/2020, CMA  Note - This record has been created using Ellen Henri.  Chart creation errors have been sought, but may not always  have been located. Such creation errors do not reflect on  the standard of medical care.

## 2020-11-03 ENCOUNTER — Ambulatory Visit: Payer: 59 | Admitting: Rheumatology

## 2020-11-03 NOTE — Progress Notes (Signed)
Office Visit Note  Patient: Colton Anthony             Date of Birth: Aug 23, 1980           MRN: [id number]             PCP: [facility] Referring: [facility] Visit Date: 11/04/2020 Occupation: @GUAROCC @  Subjective:  Medication management   History of Present Illness: Colton Anthony is a 40 y.o. male with a history of psoriatic arthritis and psoriasis.  He has been taking Humira 40 mg subcu every other week and methotrexate 6 tablets p.o. weekly along with folic acid.  He denies any joint stiffness joint pain or joint swelling.  He is relocating to [place] towards the end of this month.  He is already established with a rheumatologist in [place].  He denies any psoriasis lesions.  Activities of Daily Living:  Patient reports morning stiffness for 0 minutes.   Patient Denies nocturnal pain.  Difficulty dressing/grooming: Denies Difficulty climbing stairs: Denies Difficulty getting out of chair: Denies Difficulty using hands for taps, buttons, cutlery, and/or writing: Denies  Review of Systems  Constitutional:  Negative for fatigue.  HENT:  Negative for mouth sores, mouth dryness and nose dryness.   Eyes:  Negative for pain, itching and dryness.  Respiratory:  Negative for shortness of breath and difficulty breathing.   Cardiovascular:  Negative for chest pain and palpitations.  Gastrointestinal:  Negative for blood in stool, constipation and diarrhea.  Endocrine: Negative for increased urination.  Genitourinary:  Negative for difficulty urinating.  Musculoskeletal:  Negative for joint pain, joint pain, joint swelling, myalgias, morning stiffness, muscle tenderness and myalgias.  Skin:  Negative for color change, rash and redness.  Allergic/Immunologic: Negative for susceptible to infections.  Neurological:  Negative for dizziness, numbness, headaches, memory loss and weakness.  Hematological:  Negative for bruising/bleeding tendency.   Psychiatric/Behavioral:  Negative for confusion.    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 No pertinent surgical history. Social History   Social History Narrative   Not on file   Immunization History  Administered Date(s) Administered   Moderna Sars-Covid-2 Vaccination 08/06/2019, 10/03/2019, 05/01/2020, 11/02/2020     Objective: Vital Signs: BP 109/70 (BP Location: Left Arm, Patient Position: Sitting, Cuff Size: Normal)   Pulse 73   Ht 5\' 10"  (1.778 m)   Wt 179 lb 3.2 oz (81.3 kg)   BMI 25.71 kg/m    Physical Exam Vitals and nursing note reviewed.  Constitutional:      Appearance: He is well-developed.  HENT:     Head: Normocephalic and atraumatic.  Eyes:     Conjunctiva/sclera: Conjunctivae normal.     Pupils: Pupils are equal, round, and reactive to light.  Cardiovascular:     Rate and Rhythm: Normal rate and regular rhythm.     Heart sounds: Normal heart sounds.  Pulmonary:     Effort: Pulmonary effort is normal.     Breath sounds: Normal breath sounds.  Abdominal:     General: Bowel sounds are normal.     Palpations: Abdomen is soft.  Musculoskeletal:     Cervical back: Normal range of motion and neck supple.  Skin:    General: Skin is warm and dry.     Capillary Refill: Capillary refill takes less than 2 seconds.  Neurological:     Mental Status: He is alert and oriented to person, place, and time.  Psychiatric:        Behavior: Behavior normal.     Musculoskeletal Exam: C-spine thoracic and lumbar spine were in good  range of motion.  He had no tenderness over SI joints.  Shoulder joints, elbow joints, wrist joints with good range of motion.  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  He had no synovitis on my examination today.  Hip joints, knee joints, ankles, MTPs and PIPs with good range of motion with no tenderness.  He had no evidence of Achilles tendinitis or plantar fasciitis.  CDAI Exam: CDAI Score: -- Patient Global: --; Provider Global: -- Swollen: --; Tender: -- Joint Exam 11/04/2020   No joint exam has been documented for this visit   There is currently no information documented on the homunculus. Go to the Rheumatology activity and complete the homunculus joint exam.  Investigation: No additional findings.  Imaging: No results found.  Recent Labs: Lab Results  Component Value Date   WBC 5.5 08/04/2020   HGB 13.6 08/04/2020   PLT 207 08/04/2020   NA 140 08/04/2020   K 5.3 08/04/2020   CL 106 08/04/2020   CO2 26 08/04/2020   GLUCOSE 90 08/04/2020   BUN 13 08/04/2020   CREATININE 0.83 08/04/2020   BILITOT 1.1 08/04/2020   ALKPHOS 43 10/26/2016   AST 25 08/04/2020   ALT 37 08/04/2020   PROT 7.2 08/04/2020   ALBUMIN 4.4 10/26/2016   CALCIUM 9.5 08/04/2020   GFRAA 128 08/04/2020    Speciality Comments: No specialty comments available.  Procedures:  No procedures performed Allergies: Patient has no known allergies.   Assessment / Plan:     Visit Diagnoses: Psoriatic arthritis (HCC) - erosive, H/O dactylitis, , TMJ arthritis: He has done well on the combination of methotrexate and Humira.  He had no synovitis on my examination today.  He denies any recent episode of iritis, Achilles tendinitis or plantar fasciitis.  Psoriasis-he had no active psoriasis lesions.  High risk medication use - Humira 40 mg sq injection every 14 days, MTX 6 tabs po q wk, and folic acid 2 mg po qd.  - Plan: CBC with Differential/Platelet, COMPLETE METABOLIC  PANEL WITH GFR today and then every 3 months.  He is relocating to [place].  He will continue care with the rheumatologist.  I have advised him to get labs every 3 months over there.  He was also getting chest x-ray on yearly basis.  He will get a chest x-ray after he moves to [place].  He had COVID-19 booster this week.  He is holding methotrexate and will resume methotrexate in a week.  He decided to hold Humira as well.  He has been advised to hold Humira in case he develops an infection and resume Humira once infection resolves.  Information was placed in the AVS.  He has been advised to get annual skin examination to screen for nonmelanoma skin cancer while he is on Humira.  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 Yearly CXR required.  CXR updated on 12/24/19.   Increased risk of heart disease with psoriatic arthritis was also discussed.  Dietary modifications and exercise was emphasized.  Orders: Orders Placed This Encounter  Procedures   CBC with Differential/Platelet   COMPLETE METABOLIC PANEL WITH GFR   Meds ordered this encounter  Medications   methotrexate (RHEUMATREX) 2.5 MG tablet    Sig: Take 6 tablets by mouth once weekly. Caution:Chemotherapy. Protect from light.    Dispense:  72 tablet    Refill:  0      Follow-Up Instructions: Return if symptoms worsen or fail to improve, for Psoriatic arthritis.   Pollyann Savoy, MD  Note - This record has been created using Animal nutritionist.  Chart creation errors have been sought, but may not always  have been located. Such creation errors do not reflect on  the standard of medical care.

## 2020-11-04 ENCOUNTER — Ambulatory Visit: Payer: 59 | Admitting: Rheumatology

## 2020-11-04 ENCOUNTER — Other Ambulatory Visit: Payer: Self-pay

## 2020-11-04 ENCOUNTER — Encounter: Payer: Self-pay | Admitting: Rheumatology

## 2020-11-04 VITALS — BP 109/70 | HR 73 | Ht 70.0 in | Wt 179.2 lb

## 2020-11-04 DIAGNOSIS — R7612 Nonspecific reaction to cell mediated immunity measurement of gamma interferon antigen response without active tuberculosis: Secondary | ICD-10-CM

## 2020-11-04 DIAGNOSIS — L4050 Arthropathic psoriasis, unspecified: Secondary | ICD-10-CM

## 2020-11-04 DIAGNOSIS — Z79899 Other long term (current) drug therapy: Secondary | ICD-10-CM

## 2020-11-04 DIAGNOSIS — L409 Psoriasis, unspecified: Secondary | ICD-10-CM

## 2020-11-04 LAB — COMPLETE METABOLIC PANEL WITH GFR
AG Ratio: 1.6 (calc) (ref 1.0–2.5)
ALT: 36 U/L (ref 9–46)
AST: 23 U/L (ref 10–40)
Albumin: 4.4 g/dL (ref 3.6–5.1)
Alkaline phosphatase (APISO): 44 U/L (ref 36–130)
BUN: 12 mg/dL (ref 7–25)
CO2: 29 mmol/L (ref 20–32)
Calcium: 9.9 mg/dL (ref 8.6–10.3)
Chloride: 106 mmol/L (ref 98–110)
Creat: 0.97 mg/dL (ref 0.60–1.26)
Globulin: 2.8 g/dL (calc) (ref 1.9–3.7)
Glucose, Bld: 71 mg/dL (ref 65–99)
Potassium: 4.8 mmol/L (ref 3.5–5.3)
Sodium: 140 mmol/L (ref 135–146)
Total Bilirubin: 0.9 mg/dL (ref 0.2–1.2)
Total Protein: 7.2 g/dL (ref 6.1–8.1)
eGFR: 102 mL/min/{1.73_m2} (ref >=60)

## 2020-11-04 LAB — CBC WITH DIFFERENTIAL/PLATELET
Absolute Monocytes: 450 cells/uL (ref 200–950)
Basophils Absolute: 80 cells/uL (ref 0–200)
Basophils Relative: 1.6 %
Eosinophils Absolute: 130 cells/uL (ref 15–500)
Eosinophils Relative: 2.6 %
HCT: 44.8 % (ref 38.5–50.0)
Hemoglobin: 14.7 g/dL (ref 13.2–17.1)
Lymphs Abs: 2050 cells/uL (ref 850–3900)
MCH: 30 pg (ref 27.0–33.0)
MCHC: 32.8 g/dL (ref 32.0–36.0)
MCV: 91.4 fL (ref 80.0–100.0)
MPV: 11.8 fL (ref 7.5–12.5)
Monocytes Relative: 9 %
Neutro Abs: 2290 cells/uL (ref 1500–7800)
Neutrophils Relative %: 45.8 %
Platelets: 230 10*3/uL (ref 140–400)
RBC: 4.9 10*6/uL (ref 4.20–5.80)
RDW: 14.1 % (ref 11.0–15.0)
Total Lymphocyte: 41 %
WBC: 5 10*3/uL (ref 3.8–10.8)

## 2020-11-04 MED ORDER — METHOTREXATE 2.5 MG PO TABS: ORAL_TABLET | ORAL | 0 refills | Status: AC

## 2020-11-04 NOTE — Patient Instructions (Addendum)
Standing Labs We placed an order today for your standing lab work.   Please have your standing labs drawn in October and every 3 months  CXR is due in 12/2020  If possible, please have your labs drawn 2 weeks prior to your appointment so that the provider can discuss your results at your appointment.  Please note that you may see your imaging and lab results in MyChart before we have reviewed them. We may be awaiting multiple results to interpret others before contacting you. Please allow our office up to 72 hours to thoroughly review all of the results before contacting the office for clarification of your results.  We have open lab daily: Monday through Thursday from 1:30-4:30 PM and Friday from 1:30-4:00 PM at the office of Dr. Pollyann Savoy, [facility].   Please be advised, all patients with office appointments requiring lab work will take precedent over walk-in lab work.  If possible, please come for your lab work on Monday and Friday afternoons, as you may experience shorter wait times. The office is located at [address], [place], [place] [address] No appointment is necessary.   Labs are drawn by [facility]. Please bring your co-pay at the time of your lab draw.  You may receive a bill from [facility] for your lab work.  If you wish to have your labs drawn at another location, please call the office 24 hours in advance to send orders.  If you have any questions regarding directions or hours of operation,  please call [phone].   As a reminder, please drink plenty of water prior to coming for your lab work. Thanks!   If you test POSITIVE for COVID19 and have MILD to MODERATE symptoms: First, call your PCP if you would like to receive COVID19 treatment AND Hold your medications during the infection and for at least 1 week after your symptoms have resolved: Injectable medication (Benlysta, Cimzia, Cosentyx, Enbrel, Humira, Orencia, Remicade, Simponi,  Stelara, Taltz, Tremfya) Methotrexate Leflunomide (Arava) Mycophenolate (Cellcept) Harriette Ohara, Olumiant, or Rinvoq If you take Actemra or Kevzara, you DO NOT need to hold these for COVID19 infection.  If you test POSITIVE for COVID19 and have NO symptoms: First, call your PCP if you would like to receive COVID19 treatment AND Hold your medications for at least 10 days after the day that you tested positive Injectable medication (Benlysta, Cimzia, Cosentyx, Enbrel, Humira, Orencia, Remicade, Simponi, Stelara, Taltz, Tremfya) Methotrexate Leflunomide (Arava) Mycophenolate (Cellcept) Harriette Ohara, Olumiant, or Rinvoq If you take Actemra or Kevzara, you DO NOT need to hold these for COVID19 infection.  If you have signs or symptoms of an infection or start antibiotics: First, call your PCP for workup of your infection. Hold your medication through the infection, until you complete your antibiotics, and until symptoms resolve if you take the following: Injectable medication (Actemra, Benlysta, Cimzia, Cosentyx, Enbrel, Humira, Kevzara, Orencia, Remicade, Simponi, Stelara, Taltz, Tremfya) Methotrexate Leflunomide (Arava) Mycophenolate (Cellcept) Harriette Ohara, Olumiant, or Rinvoq   Please schedule annual skin examination to screen for nonmelanoma skin cancer while you are on Humira.    Heart Disease Prevention   Your inflammatory disease increases your risk of heart disease which includes heart attack, stroke, atrial fibrillation (irregular heartbeats), high blood pressure, heart failure and atherosclerosis (plaque in the arteries).  It is important to reduce your risk by:   Keep blood pressure, cholesterol, and blood sugar at healthy levels   Smoking Cessation   Maintain a healthy weight  BMI 20-25  Eat a healthy diet  Plenty of fresh fruit, vegetables, and whole grains  Limit saturated fats, foods high in sodium, and added sugars  DASH and Mediterranean diet   Increase physical activity   Recommend moderate physically activity for 150 minutes per week/ 30 minutes a day for five days a week These can be broken up into three separate ten-minute sessions during the day.   Reduce Stress  Meditation, slow breathing exercises, yoga, coloring books  Dental visits twice a year

## 2020-12-16 ENCOUNTER — Other Ambulatory Visit: Payer: Self-pay | Admitting: Rheumatology

## 2020-12-30 ENCOUNTER — Other Ambulatory Visit: Payer: Self-pay | Admitting: Rheumatology

## 2020-12-31 ENCOUNTER — Other Ambulatory Visit: Payer: Self-pay

## 2020-12-31 MED ORDER — HUMIRA PEN 40 MG/0.8ML ~~LOC~~ PNKT: PEN_INJECTOR | SUBCUTANEOUS | 0 refills | Status: AC

## 2020-12-31 NOTE — Telephone Encounter (Signed)
Patient called stating he received a message from [facility] that his refill request for Humira was denied by Dr. Corliss Skains.  Patient states "Dr. Corliss Skains told him she would continue prescribing medications until the end of October 2022 or until he was able to schedule with a rheumatologist in [place]."  Patient requested a return call.

## 2020-12-31 NOTE — Telephone Encounter (Signed)
Next Visit: patient has moved to [place], [place]  Last Visit: 11/04/2020  XB:[id number] arthritis   Current Dose per office note 11/04/2020: Humira 40 mg sq injection every 14 days  Labs: 11/04/2020 CBC and CMP WNL  TB Gold: 12/24/2019  No active cardiopulmonary disease  Okay to refill Humira?

## 2021-03-15 ENCOUNTER — Other Ambulatory Visit: Payer: Self-pay | Admitting: Rheumatology

## 2021-05-04 ENCOUNTER — Other Ambulatory Visit: Payer: Self-pay | Admitting: Rheumatology

## 2022-08-24 IMAGING — CR DG CHEST 2V
2 series · 2 of 2 positions shown · non-contrast
Comparison: Chest x-ray dated October 09, 2017.

CLINICAL DATA: Immunosuppressive therapy.

EXAM:
CHEST - 2 VIEW

[chest pa]
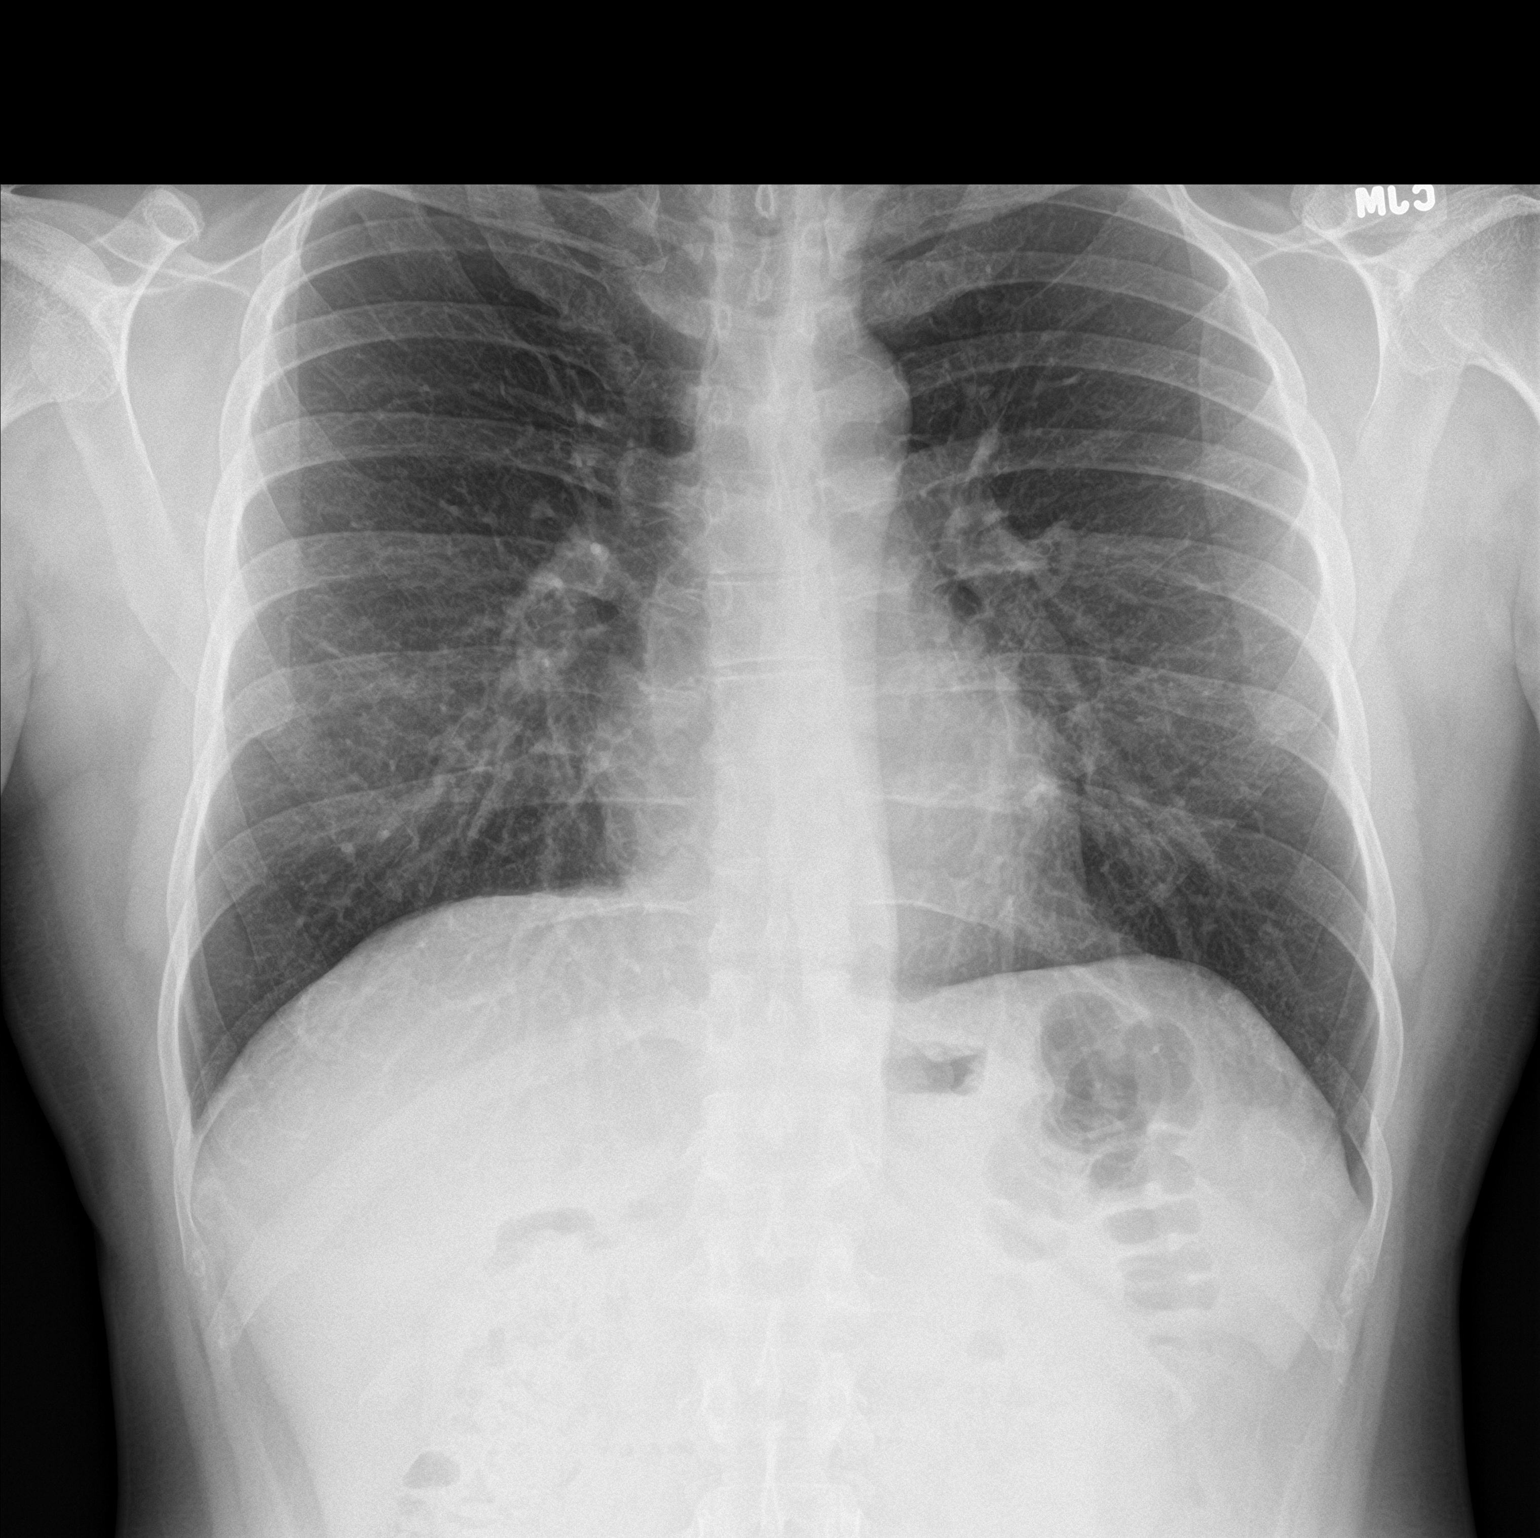

[chest lat]
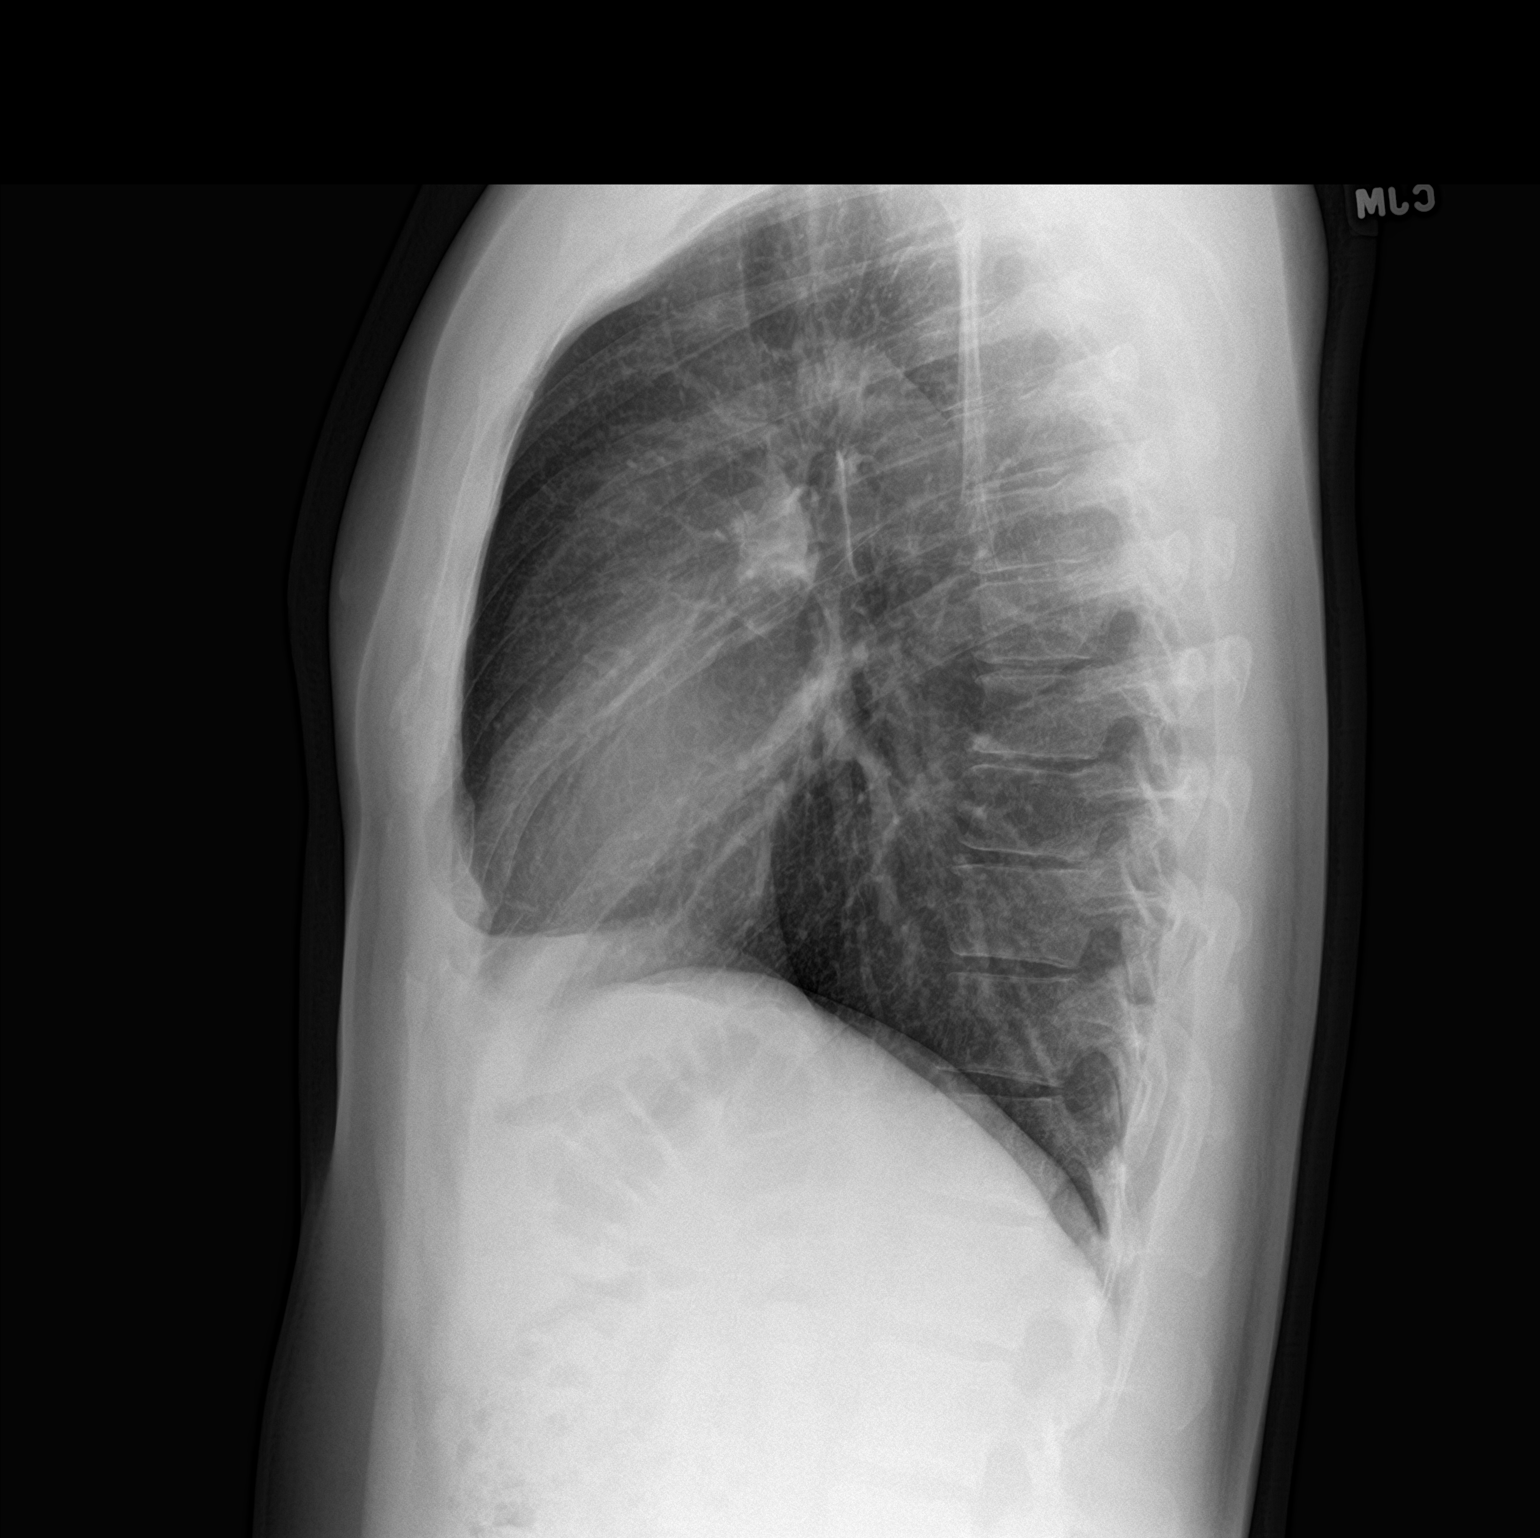

[2 of 2 positions shown; findings below may reference images not displayed]

FINDINGS: The heart size and mediastinal contours are within normal limits.
Both lungs are clear. The visualized skeletal structures are
unremarkable.
IMPRESSION: No active cardiopulmonary disease.

## 2023-03-10 NOTE — Progress Notes (Deleted)
   Office Visit Note  Patient: Colton Anthony             Date of Birth: 1980-12-13           MRN: [id number]             PCP: [facility] Referring: [facility] Visit Date: 03/15/2023 Occupation: @GUAROCC @  Subjective:  No chief complaint on file.   History of Present Illness: Colton Anthony is a 42 y.o. male ***     Activities of Daily Living:  Patient reports morning stiffness for *** {minute/hour:19697}.   Patient {ACTIONS;DENIES/REPORTS:21021675::"Denies"} nocturnal pain.  Difficulty dressing/grooming: {ACTIONS;DENIES/REPORTS:21021675::"Denies"} Difficulty climbing stairs: {ACTIONS;DENIES/REPORTS:21021675::"Denies"} Difficulty getting out of chair: {ACTIONS;DENIES/REPORTS:21021675::"Denies"} Difficulty using hands for taps, buttons, cutlery, and/or writing: {ACTIONS;DENIES/REPORTS:21021675::"Denies"}  No Rheumatology ROS completed.   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 Social History   Social History Narrative   Not on file   Immunization History  Administered Date(s) Administered   Moderna Sars-Covid-2 Vaccination 08/06/2019, 10/03/2019, 05/01/2020, 11/02/2020     Objective: Vital Signs: There were no vitals taken for this visit.   Physical Exam   Musculoskeletal Exam: ***  CDAI Exam: CDAI Score: -- Patient Global: --; Provider Global: -- Swollen: --; Tender: -- Joint Exam 03/15/2023   No joint exam has been documented for this visit   There is currently no  information documented on the homunculus. Go to the Rheumatology activity and complete the homunculus joint exam.  Investigation: No additional findings.  Imaging: No results found.  Recent Labs: Lab Results  Component Value Date   WBC 5.0 11/04/2020   HGB 14.7 11/04/2020   PLT 230 11/04/2020   NA 140 11/04/2020   K 4.8 11/04/2020   CL 106 11/04/2020   CO2 29 11/04/2020   GLUCOSE 71 11/04/2020   BUN 12 11/04/2020   CREATININE 0.97 11/04/2020   BILITOT 0.9 11/04/2020   ALKPHOS 43 10/26/2016   AST 23 11/04/2020   ALT 36 11/04/2020   PROT 7.2 11/04/2020   ALBUMIN 4.4 10/26/2016   CALCIUM 9.9 11/04/2020   GFRAA 128 08/04/2020    Speciality Comments: No specialty comments available.  Procedures:  No procedures performed Allergies: Patient has no known allergies.   Assessment / Plan:     Visit Diagnoses: No diagnosis found.  Orders: No orders of the defined types were placed in this encounter.  No orders of the defined types were placed in this encounter.   Face-to-face time spent with patient was *** minutes. Greater than 50% of time was spent in counseling and coordination of care.  Follow-Up Instructions: No follow-ups on file.   Ellen Henri, CMA  Note - This record has been created using Animal nutritionist.  Chart creation errors have been sought, but may not always  have been located. Such creation errors do not reflect on  the standard of medical care.

## 2023-03-15 ENCOUNTER — Ambulatory Visit: Payer: 59 | Admitting: Rheumatology

## 2023-03-15 DIAGNOSIS — R7612 Nonspecific reaction to cell mediated immunity measurement of gamma interferon antigen response without active tuberculosis: Secondary | ICD-10-CM

## 2023-03-15 DIAGNOSIS — Z79899 Other long term (current) drug therapy: Secondary | ICD-10-CM

## 2023-03-15 DIAGNOSIS — L409 Psoriasis, unspecified: Secondary | ICD-10-CM

## 2023-03-15 DIAGNOSIS — L4050 Arthropathic psoriasis, unspecified: Secondary | ICD-10-CM
